# Patient Record
Sex: Male | Born: 1960 | Race: White | Hispanic: No | Marital: Single | Smoking: Former smoker
Health system: Southern US, Community
[De-identification: ages and names within clinical notes are randomized; demographics above are authoritative.]

## PROBLEM LIST (undated history)

## (undated) DIAGNOSIS — E039 Hypothyroidism, unspecified: Secondary | ICD-10-CM

## (undated) DIAGNOSIS — N400 Enlarged prostate without lower urinary tract symptoms: Secondary | ICD-10-CM

## (undated) DIAGNOSIS — I2510 Atherosclerotic heart disease of native coronary artery without angina pectoris: Secondary | ICD-10-CM

## (undated) DIAGNOSIS — N401 Enlarged prostate with lower urinary tract symptoms: Secondary | ICD-10-CM

## (undated) DIAGNOSIS — A630 Anogenital (venereal) warts: Secondary | ICD-10-CM

## (undated) DIAGNOSIS — N138 Other obstructive and reflux uropathy: Secondary | ICD-10-CM

## (undated) DIAGNOSIS — I10 Essential (primary) hypertension: Secondary | ICD-10-CM

## (undated) DIAGNOSIS — Z720 Tobacco use: Secondary | ICD-10-CM

## (undated) HISTORY — DX: Benign prostatic hyperplasia without lower urinary tract symptoms: N40.0

## (undated) HISTORY — DX: Essential (primary) hypertension: I10

## (undated) HISTORY — PX: WISDOM TOOTH EXTRACTION: SHX21

## (undated) HISTORY — DX: Other obstructive and reflux uropathy: N40.1

## (undated) HISTORY — DX: Benign prostatic hyperplasia with lower urinary tract symptoms: N13.8

## (undated) HISTORY — DX: Tobacco use: Z72.0

## (undated) HISTORY — DX: Hypothyroidism, unspecified: E03.9

## (undated) HISTORY — DX: Anogenital (venereal) warts: A63.0

---

## 1998-10-20 ENCOUNTER — Emergency Department (HOSPITAL_COMMUNITY): Admission: EM | Admit: 1998-10-20 | Discharge: 1998-10-20 | Payer: Self-pay | Admitting: Internal Medicine

## 1998-12-14 HISTORY — PX: KNEE ARTHROSCOPY: SUR90

## 2003-12-15 HISTORY — PX: LASIK: SHX215

## 2011-11-27 ENCOUNTER — Encounter: Payer: Self-pay | Admitting: Internal Medicine

## 2011-11-27 ENCOUNTER — Other Ambulatory Visit (INDEPENDENT_AMBULATORY_CARE_PROVIDER_SITE_OTHER): Payer: Managed Care, Other (non HMO)

## 2011-11-27 ENCOUNTER — Ambulatory Visit (INDEPENDENT_AMBULATORY_CARE_PROVIDER_SITE_OTHER): Payer: Managed Care, Other (non HMO) | Admitting: Internal Medicine

## 2011-11-27 VITALS — BP 160/92 | HR 86 | Temp 98.8°F | Resp 16 | Wt 220.8 lb

## 2011-11-27 DIAGNOSIS — Z0000 Encounter for general adult medical examination without abnormal findings: Secondary | ICD-10-CM

## 2011-11-27 DIAGNOSIS — R9431 Abnormal electrocardiogram [ECG] [EKG]: Secondary | ICD-10-CM

## 2011-11-27 DIAGNOSIS — I10 Essential (primary) hypertension: Secondary | ICD-10-CM

## 2011-11-27 LAB — CBC WITH DIFFERENTIAL/PLATELET
Basophils Absolute: 0 10*3/uL (ref 0.0–0.1)
Eosinophils Relative: 0.3 % (ref 0.0–5.0)
MCV: 95.5 fl (ref 78.0–100.0)
Monocytes Absolute: 0.8 10*3/uL (ref 0.1–1.0)
Neutrophils Relative %: 80.3 % — ABNORMAL HIGH (ref 43.0–77.0)
Platelets: 186 10*3/uL (ref 150.0–400.0)
RDW: 12.8 % (ref 11.5–14.6)
WBC: 11.3 10*3/uL — ABNORMAL HIGH (ref 4.5–10.5)

## 2011-11-27 LAB — COMPREHENSIVE METABOLIC PANEL
ALT: 23 U/L (ref 0–53)
AST: 23 U/L (ref 0–37)
Albumin: 4.2 g/dL (ref 3.5–5.2)
Alkaline Phosphatase: 64 U/L (ref 39–117)
Glucose, Bld: 82 mg/dL (ref 70–99)
Potassium: 4.7 mEq/L (ref 3.5–5.1)
Sodium: 140 mEq/L (ref 135–145)
Total Bilirubin: 0.4 mg/dL (ref 0.3–1.2)
Total Protein: 7.5 g/dL (ref 6.0–8.3)

## 2011-11-27 LAB — URINALYSIS, ROUTINE W REFLEX MICROSCOPIC
Bilirubin Urine: NEGATIVE
Ketones, ur: NEGATIVE
Specific Gravity, Urine: <=1.005 (ref 1.000–1.030)
Total Protein, Urine: NEGATIVE
Urine Glucose: NEGATIVE
pH: 6 (ref 5.0–8.0)

## 2011-11-27 LAB — PSA: PSA: 0.34 ng/mL (ref 0.10–4.00)

## 2011-11-27 LAB — LIPID PANEL
HDL: 39.7 mg/dL (ref >39.00)
Total CHOL/HDL Ratio: 4
Triglycerides: 109 mg/dL (ref 0.0–149.0)
VLDL: 21.8 mg/dL (ref 0.0–40.0)

## 2011-11-27 MED ORDER — OLMESARTAN MEDOXOMIL 20 MG PO TABS: 20.0000 mg | ORAL_TABLET | Freq: Every day | ORAL | Status: DC

## 2011-11-27 NOTE — Assessment & Plan Note (Signed)
Check labs for secondary causes and to look for end organ damage and will start benicar today

## 2011-11-27 NOTE — Patient Instructions (Signed)
Health Maintenance, Males A healthy lifestyle and preventative care can promote health and wellness.  Maintain regular health, dental, and eye exams.   Eat a healthy diet. Foods like vegetables, fruits, whole grains, low-fat dairy products, and lean protein foods contain the nutrients you need without too many calories. Decrease your intake of foods high in solid fats, added sugars, and salt. Get information about a proper diet from your caregiver, if necessary.   Regular physical exercise is one of the most important things you can do for your health. Most adults should get at least 150 minutes of moderate-intensity exercise (any activity that increases your heart rate and causes you to sweat) each week. In addition, most adults need muscle-strengthening exercises on 2 or more days a week.    Maintain a healthy weight. The body mass index (BMI) is a screening tool to identify possible weight problems. It provides an estimate of body fat based on height and weight. Your caregiver can help determine your BMI, and can help you achieve or maintain a healthy weight. For adults 20 years and older:   A BMI below 18.5 is considered underweight.   A BMI of 18.5 to 24.9 is normal.   A BMI of 25 to 29.9 is considered overweight.   A BMI of 30 and above is considered obese.   Maintain normal blood lipids and cholesterol by exercising and minimizing your intake of saturated fat. Eat a balanced diet with plenty of fruits and vegetables. Blood tests for lipids and cholesterol should begin at age 20 and be repeated every 5 years. If your lipid or cholesterol levels are high, you are over 50, or you are a high risk for heart disease, you may need your cholesterol levels checked more frequently.Ongoing high lipid and cholesterol levels should be treated with medicines, if diet and exercise are not effective.   If you smoke, find out from your caregiver how to quit. If you do not use tobacco, do not start.    If you choose to drink alcohol, do not exceed 2 drinks per day. One drink is considered to be 12 ounces (355 mL) of beer, 5 ounces (148 mL) of wine, or 1.5 ounces (44 mL) of liquor.   Avoid use of street drugs. Do not share needles with anyone. Ask for help if you need support or instructions about stopping the use of drugs.   High blood pressure causes heart disease and increases the risk of stroke. Blood pressure should be checked at least every 1 to 2 years. Ongoing high blood pressure should be treated with medicines if weight loss and exercise are not effective.   If you are 45 to 50 years old, ask your caregiver if you should take aspirin to prevent heart disease.   Diabetes screening involves taking a blood sample to check your fasting blood sugar level. This should be done once every 3 years, after age 45, if you are within normal weight and without risk factors for diabetes. Testing should be considered at a younger age or be carried out more frequently if you are overweight and have at least 1 risk factor for diabetes.   Colorectal cancer can be detected and often prevented. Most routine colorectal cancer screening begins at the age of 50 and continues through age 75. However, your caregiver may recommend screening at an earlier age if you have risk factors for colon cancer. On a yearly basis, your caregiver may provide home test kits to check for hidden   blood in the stool. Use of a small camera at the end of a tube, to directly examine the colon (sigmoidoscopy or colonoscopy), can detect the earliest forms of colorectal cancer. Talk to your caregiver about this at age 50, when routine screening begins. Direct examination of the colon should be repeated every 5 to 10 years through age 75, unless early forms of pre-cancerous polyps or small growths are found.   Healthy men should no longer receive prostate-specific antigen (PSA) blood tests as part of routine cancer screening. Consult with  your caregiver about prostate cancer screening.   Practice safe sex. Use condoms and avoid high-risk sexual practices to reduce the spread of sexually transmitted infections (STIs).   Use sunscreen with a sun protection factor (SPF) of 30 or greater. Apply sunscreen liberally and repeatedly throughout the day. You should seek shade when your shadow is shorter than you. Protect yourself by wearing long sleeves, pants, a wide-brimmed hat, and sunglasses year round, whenever you are outdoors.   Notify your caregiver of new moles or changes in moles, especially if there is a change in shape or color. Also notify your caregiver if a mole is larger than the size of a pencil eraser.   A one-time screening for abdominal aortic aneurysm (AAA) and surgical repair of large AAAs by sound wave imaging (ultrasonography) is recommended for ages 65 to 75 years who are current or former smokers.   Stay current with your immunizations.  Document Released: 05/28/2008 Document Revised: 08/12/2011 Document Reviewed: 04/27/2011 [employer] Patient Information 2012 [employer].Hypertension As your heart beats, it forces blood through your arteries. This force is your blood pressure. If the pressure is too high, it is called hypertension (HTN) or high blood pressure. HTN is dangerous because you may have it and not know it. High blood pressure may mean that your heart has to work harder to pump blood. Your arteries may be narrow or stiff. The extra work puts you at risk for heart disease, stroke, and other problems.  Blood pressure consists of two numbers, a higher number over a lower, 110/72, for example. It is stated as "110 over 72." The ideal is below 120 for the top number (systolic) and under 80 for the bottom (diastolic). Write down your blood pressure today. You should pay close attention to your blood pressure if you have certain conditions such as:  Heart failure.   Prior heart attack.   Diabetes   Chronic  kidney disease.   Prior stroke.   Multiple risk factors for heart disease.  To see if you have HTN, your blood pressure should be measured while you are seated with your arm held at the level of the heart. It should be measured at least twice. A one-time elevated blood pressure reading (especially in the Emergency Department) does not mean that you need treatment. There may be conditions in which the blood pressure is different between your right and left arms. It is important to see your caregiver soon for a recheck. Most people have essential hypertension which means that there is not a specific cause. This type of high blood pressure may be lowered by changing lifestyle factors such as:  Stress.   Smoking.   Lack of exercise.   Excessive weight.   Drug/tobacco/alcohol use.   Eating less salt.  Most people do not have symptoms from high blood pressure until it has caused damage to the body. Effective treatment can often prevent, delay or reduce that damage. TREATMENT    When a cause has been identified, treatment for high blood pressure is directed at the cause. There are a large number of medications to treat HTN. These fall into several categories, and your caregiver will help you select the medicines that are best for you. Medications may have side effects. You should review side effects with your caregiver. If your blood pressure stays high after you have made lifestyle changes or started on medicines,   Your medication(s) may need to be changed.   Other problems may need to be addressed.   Be certain you understand your prescriptions, and know how and when to take your medicine.   Be sure to follow up with your caregiver within the time frame advised (usually within two weeks) to have your blood pressure rechecked and to review your medications.   If you are taking more than one medicine to lower your blood pressure, make sure you know how and at what times they should be taken.  Taking two medicines at the same time can result in blood pressure that is too low.  SEEK IMMEDIATE MEDICAL CARE IF:  You develop a severe headache, blurred or changing vision, or confusion.   You have unusual weakness or numbness, or a faint feeling.   You have severe chest or abdominal pain, vomiting, or breathing problems.  MAKE SURE YOU:   Understand these instructions.   Will watch your condition.   Will get help right away if you are not doing well or get worse.  Document Released: 11/30/2005 Document Revised: 08/12/2011 Document Reviewed: 07/20/2008 Exit[employer] Patient Information 2012 [employer]. 

## 2011-11-27 NOTE — Assessment & Plan Note (Signed)
Exam done, labs ordered, GI referral done, vaccines updated, pt ed material was given

## 2011-11-27 NOTE — Progress Notes (Signed)
  Subjective:    Patient ID: Henry Owen, male    DOB: November 06, 1961, 50 y.o.   MRN: [id number]  Hypertension This is a chronic problem. The current episode started more than 1 year ago. The problem has been gradually worsening since onset. The problem is uncontrolled. Pertinent negatives include no anxiety, blurred vision, chest pain, headaches, malaise/fatigue, neck pain, orthopnea, palpitations, peripheral edema, PND, shortness of breath or sweats. There are no associated agents to hypertension. Past treatments include nothing. Compliance problems include exercise and diet.       Review of Systems  Constitutional: Negative for malaise/fatigue.  HENT: Negative for neck pain.   Eyes: Negative for blurred vision.  Respiratory: Negative for shortness of breath.   Cardiovascular: Negative for chest pain, palpitations, orthopnea and PND.  Neurological: Negative for headaches.       Objective:   Physical Exam  Vitals reviewed. Constitutional: He is oriented to person, place, and time. He appears well-developed and well-nourished. No distress.  HENT:  Head: Normocephalic and atraumatic.  Mouth/Throat: Oropharynx is clear and moist. No oropharyngeal exudate.  Eyes: Conjunctivae are normal. Right eye exhibits no discharge. Left eye exhibits no discharge. No scleral icterus.  Neck: Normal range of motion. Neck supple. No JVD present. No tracheal deviation present. No thyromegaly present.  Cardiovascular: Normal rate, regular rhythm, normal heart sounds and intact distal pulses.  Exam reveals no gallop and no friction rub.   No murmur heard. Pulmonary/Chest: Effort normal and breath sounds normal. No stridor. No respiratory distress. He has no wheezes. He has no rales. He exhibits no tenderness.  Abdominal: Soft. Bowel sounds are normal. He exhibits no distension and no mass. There is no tenderness. There is no rebound and no guarding. Hernia confirmed negative in the right inguinal area and  confirmed negative in the left inguinal area.  Genitourinary: Rectum normal, prostate normal, testes normal and penis normal. Rectal exam shows no external hemorrhoid, no internal hemorrhoid, no fissure, no mass, no tenderness and anal tone normal. Guaiac negative stool. Prostate is not enlarged and not tender. Right testis shows no mass, no swelling and no tenderness. Right testis is descended. Left testis shows no mass, no swelling and no tenderness. Left testis is descended. Uncircumcised. No phimosis, paraphimosis, hypospadias, penile erythema or penile tenderness. No discharge found.  Musculoskeletal: Normal range of motion. He exhibits no edema and no tenderness.  Lymphadenopathy:    He has no cervical adenopathy.       Right: No inguinal adenopathy present.       Left: No inguinal adenopathy present.  Neurological: He is oriented to person, place, and time.  Skin: Skin is warm and dry. No rash noted. He is not diaphoretic. No erythema. No pallor.  Psychiatric: He has a normal mood and affect. His behavior is normal. Judgment and thought content normal.          Assessment & Plan:

## 2011-11-27 NOTE — Assessment & Plan Note (Signed)
His EKG reveals poor R waves in V1 and V2, this may be a normal variant but he does have a very significant FH so I have asked him to see cardiology

## 2011-11-29 ENCOUNTER — Encounter: Payer: Self-pay | Admitting: Internal Medicine

## 2011-11-30 ENCOUNTER — Ambulatory Visit (INDEPENDENT_AMBULATORY_CARE_PROVIDER_SITE_OTHER): Payer: Managed Care, Other (non HMO) | Admitting: Cardiology

## 2011-11-30 ENCOUNTER — Encounter: Payer: Self-pay | Admitting: Cardiology

## 2011-11-30 DIAGNOSIS — F17200 Nicotine dependence, unspecified, uncomplicated: Secondary | ICD-10-CM

## 2011-11-30 DIAGNOSIS — Z720 Tobacco use: Secondary | ICD-10-CM | POA: Insufficient documentation

## 2011-11-30 DIAGNOSIS — R9431 Abnormal electrocardiogram [ECG] [EKG]: Secondary | ICD-10-CM

## 2011-11-30 DIAGNOSIS — I10 Essential (primary) hypertension: Secondary | ICD-10-CM

## 2011-11-30 DIAGNOSIS — R079 Chest pain, unspecified: Secondary | ICD-10-CM | POA: Insufficient documentation

## 2011-11-30 NOTE — Progress Notes (Signed)
   HPI: 50 year old male with no prior cardiac history for evaluation of abnormal electrocardiogram and chest pain. Patient has had occasional chest pain for approximately 3 years. It occurs in his left and right lateral chest area. It lasts approximately 1 minute and resolves spontaneously. Not exertional. No associated symptoms. It can increase with inspiration. He had a recent electrocardiogram. This showed sinus rhythm and cannot rule out prior septal infarct. Because of the above we were asked to further evaluate.  Current Outpatient Prescriptions  Medication Sig Dispense Refill  . ibuprofen (ADVIL,MOTRIN) 100 MG tablet Take 100 mg by mouth every 6 (six) hours as needed.        Marland Kitchen olmesartan (BENICAR) 20 MG tablet Take 1 tablet (20 mg total) by mouth daily.  49 tablet  0     Past Medical History  Diagnosis Date  . Warts, genital   . Hypertension     Past Surgical History  Procedure Date  . Lasik 2005  . Knee arthroscopy 2000    History   Social History  . Marital Status: Single    Spouse Name: N/A    Number of Children: N/A  . Years of Education: N/A   Occupational History  . Not on file.   Social History Main Topics  . Smoking status: Current Everyday Smoker -- 1.5 packs/day for 25 years    Types: Cigarettes  . Smokeless tobacco: Not on file  . Alcohol Use: No  . Drug Use: No  . Sexually Active: Yes   Other Topics Concern  . Not on file   Social History Narrative   Caffienated drinks-yesSeat belt use often-yesRegular Exercise-noSmoke alarm in the home-yesFirearms/guns in the home-yesHistory of physical abuse-no    ROS: no fevers or chills, productive cough, hemoptysis, dysphasia, odynophagia, melena, hematochezia, dysuria, hematuria, rash, seizure activity, orthopnea, PND, pedal edema, claudication. Remaining systems are negative.  Physical Exam: Well-developed well-nourished in no acute distress.  Skin is warm and dry.  HEENT is normal.  Neck is supple. No  thyromegaly.  Chest is clear to auscultation with normal expansion.  Cardiovascular exam is regular rate and rhythm.  Abdominal exam nontender or distended. No masses palpated. Extremities show no edema. neuro grossly intact  ECG NSR, No ST changes 11/27/11 NSR, CRO prior septal MI

## 2011-11-30 NOTE — Patient Instructions (Signed)
Your physician recommends that you schedule a follow-up appointment in: AS NEEDED  Your physician has requested that you have a stress echocardiogram. For further information please visit [email or web address]. Please follow instruction sheet as given.  

## 2011-11-30 NOTE — Assessment & Plan Note (Signed)
Previous electrocardiogram shows sinus rhythm, cannot rule out prior septal infarct. I think this is most likely related to lead placement. However multiple risk factors including tobacco use and strong family history. Also with atypical chest pain. Schedule stress echocardiogram to rule out ischemia and evaluate wall motion. If normal no further evaluation.

## 2011-11-30 NOTE — Assessment & Plan Note (Signed)
Symptoms atypical. Await stress echocardiogram results.

## 2011-11-30 NOTE — Assessment & Plan Note (Signed)
Benicar recently initiated. Continue to follow blood pressure. Increase meds as needed. This will be cared for by primary care.

## 2011-11-30 NOTE — Assessment & Plan Note (Signed)
Patient counseled on discontinuing. 

## 2011-12-03 ENCOUNTER — Ambulatory Visit (HOSPITAL_BASED_OUTPATIENT_CLINIC_OR_DEPARTMENT_OTHER): Payer: Managed Care, Other (non HMO) | Admitting: Radiology

## 2011-12-03 ENCOUNTER — Ambulatory Visit (HOSPITAL_COMMUNITY): Payer: Managed Care, Other (non HMO) | Attending: Cardiology | Admitting: Radiology

## 2011-12-03 DIAGNOSIS — R072 Precordial pain: Secondary | ICD-10-CM

## 2011-12-03 DIAGNOSIS — F17200 Nicotine dependence, unspecified, uncomplicated: Secondary | ICD-10-CM | POA: Insufficient documentation

## 2011-12-03 DIAGNOSIS — R0609 Other forms of dyspnea: Secondary | ICD-10-CM | POA: Insufficient documentation

## 2011-12-03 DIAGNOSIS — R0989 Other specified symptoms and signs involving the circulatory and respiratory systems: Secondary | ICD-10-CM

## 2011-12-16 ENCOUNTER — Encounter: Payer: Self-pay | Admitting: Gastroenterology

## 2011-12-21 ENCOUNTER — Ambulatory Visit (AMBULATORY_SURGERY_CENTER): Payer: Managed Care, Other (non HMO) | Admitting: *Deleted

## 2011-12-21 DIAGNOSIS — Z1211 Encounter for screening for malignant neoplasm of colon: Secondary | ICD-10-CM

## 2011-12-21 MED ORDER — PEG-KCL-NACL-NASULF-NA ASC-C 100 G PO SOLR: ORAL | Status: DC

## 2011-12-28 ENCOUNTER — Encounter: Payer: Self-pay | Admitting: Gastroenterology

## 2011-12-28 ENCOUNTER — Encounter: Payer: Managed Care, Other (non HMO) | Admitting: Gastroenterology

## 2011-12-28 ENCOUNTER — Ambulatory Visit (AMBULATORY_SURGERY_CENTER): Payer: Managed Care, Other (non HMO) | Admitting: Gastroenterology

## 2011-12-28 VITALS — BP 144/89 | HR 84 | Temp 97.6°F | Resp 15 | Ht 72.0 in | Wt 221.0 lb

## 2011-12-28 DIAGNOSIS — D126 Benign neoplasm of colon, unspecified: Secondary | ICD-10-CM

## 2011-12-28 DIAGNOSIS — Z1211 Encounter for screening for malignant neoplasm of colon: Secondary | ICD-10-CM

## 2011-12-28 LAB — HM COLONOSCOPY

## 2011-12-28 MED ORDER — SODIUM CHLORIDE 0.9 % IV SOLN: 500.0000 mL | INTRAVENOUS | Status: DC

## 2011-12-28 NOTE — Progress Notes (Signed)
Patient did not experience any of the following events: a burn prior to discharge; a fall within the facility; wrong site/side/patient/procedure/implant event; or a hospital transfer or hospital admission upon discharge from the facility. (G8907)  

## 2011-12-28 NOTE — Patient Instructions (Addendum)
One of your biggest health concerns is your smoking.  This increases your risk for most cancers and serious cardiovascular diseases such as strokes, heart attacks.  You should try your best to stop.  If you need assistance, please contact your PCP or Smoking Cessation Class at [facility] ([phone]) or [facility] (1-800-QUIT-NOW). FOLLOW DISCHARGE INSTRUCTIONS (BLUE AND GREEN SHEETS). 

## 2011-12-28 NOTE — Op Note (Signed)
[facility] [address] [place], [place]  [address]  COLONOSCOPY PROCEDURE REPORT  PATIENT:  Henry Owen, Henry Owen  MR#:  [id number] BIRTHDATE:  [birth year], 50 yrs. old  GENDER:  male ENDOSCOPIST:  Rachael Fee, MD REF. BY:  Etta Grandchild, M.D. PROCEDURE DATE:  12/28/2011 PROCEDURE:  Colonoscopy with snare polypectomy ASA CLASS:  Class II INDICATIONS:  Routine Risk Screening MEDICATIONS:   Fentanyl 75 mcg IV, These medications were titrated to patient response per physician's verbal order, Versed 6 mg IV  DESCRIPTION OF PROCEDURE:   After the risks benefits and alternatives of the procedure were thoroughly explained, informed consent was obtained.  Digital rectal exam was performed and revealed no rectal masses.   The LB [id number] endoscope was introduced through the anus and advanced to the cecum, which was identified by both the appendix and ileocecal valve, without limitations.  The quality of the prep was fair..  The instrument was then slowly withdrawn as the colon was fully examined. <<PROCEDUREIMAGES>> FINDINGS:  A diminutive polyp was found in the transverse colon. This was removed with cold snare but not retrieved (see image3). This was otherwise a normal examination of the colon (see image4, image2, and image1).   Retroflexed views in the rectum revealed no abnormalities. COMPLICATIONS:  None  ENDOSCOPIC IMPRESSION: 1) Diminutive polyp in the transverse colon; removed but not retrieved. 2) Otherwise normal examination  RECOMMENDATIONS: 1) You should continue to follow colorectal cancer screening guidelines for "routine risk" patients with a repeat colonoscopy in 10 years  REPEAT EXAM:  10 years  ______________________________ Rachael Fee, MD  n. eSIGNED:   Rachael Fee at 12/28/2011 03:16 PM  Crista Curb, [id number]

## 2011-12-29 ENCOUNTER — Telehealth: Payer: Self-pay | Admitting: *Deleted

## 2011-12-29 NOTE — Telephone Encounter (Signed)

## 2012-01-13 ENCOUNTER — Encounter: Payer: Self-pay | Admitting: Internal Medicine

## 2012-01-13 ENCOUNTER — Ambulatory Visit (INDEPENDENT_AMBULATORY_CARE_PROVIDER_SITE_OTHER): Payer: Managed Care, Other (non HMO) | Admitting: Internal Medicine

## 2012-01-13 VITALS — BP 120/68 | HR 80 | Temp 98.8°F | Resp 16 | Wt 228.0 lb

## 2012-01-13 DIAGNOSIS — I10 Essential (primary) hypertension: Secondary | ICD-10-CM

## 2012-01-13 DIAGNOSIS — R9431 Abnormal electrocardiogram [ECG] [EKG]: Secondary | ICD-10-CM

## 2012-01-13 DIAGNOSIS — F17200 Nicotine dependence, unspecified, uncomplicated: Secondary | ICD-10-CM

## 2012-01-13 DIAGNOSIS — Z720 Tobacco use: Secondary | ICD-10-CM

## 2012-01-13 MED ORDER — OLMESARTAN MEDOXOMIL 20 MG PO TABS: 20.0000 mg | ORAL_TABLET | Freq: Every day | ORAL | Status: DC

## 2012-01-13 NOTE — Progress Notes (Signed)
  Subjective:    Patient ID: Henry Owen, male    DOB: March 17, 1961, 51 y.o.   MRN: [id number]  Hypertension This is a chronic problem. The current episode started more than 1 year ago. The problem has been gradually improving since onset. The problem is controlled. Pertinent negatives include no anxiety, blurred vision, chest pain, headaches, malaise/fatigue, neck pain, orthopnea, palpitations, peripheral edema, PND, shortness of breath or sweats. Past treatments include angiotensin blockers. The current treatment provides significant improvement. Compliance problems include exercise and diet.       Review of Systems  Constitutional: Negative.  Negative for malaise/fatigue.  HENT: Negative.  Negative for neck pain.   Eyes: Negative.  Negative for blurred vision.  Respiratory: Negative.  Negative for shortness of breath.   Cardiovascular: Negative.  Negative for chest pain, palpitations, orthopnea and PND.  Gastrointestinal: Negative.   Genitourinary: Negative.   Musculoskeletal: Negative.   Skin: Negative.   Neurological: Negative.  Negative for headaches.  Hematological: Negative.   Psychiatric/Behavioral: Negative.        Objective:   Physical Exam  Vitals reviewed. Constitutional: He is oriented to person, place, and time. He appears well-developed and well-nourished. No distress.  HENT:  Head: Normocephalic and atraumatic.  Mouth/Throat: Oropharynx is clear and moist. No oropharyngeal exudate.  Eyes: Conjunctivae are normal. Right eye exhibits no discharge. Left eye exhibits no discharge. No scleral icterus.  Neck: Normal range of motion. Neck supple. No JVD present. No tracheal deviation present. No thyromegaly present.  Cardiovascular: Normal rate, regular rhythm, normal heart sounds and intact distal pulses.  Exam reveals no gallop and no friction rub.   No murmur heard. Pulmonary/Chest: Breath sounds normal. No stridor. No respiratory distress. He has no wheezes. He has  no rales. He exhibits no tenderness.  Abdominal: Soft. Bowel sounds are normal. He exhibits no distension and no mass. There is no tenderness. There is no rebound and no guarding.  Musculoskeletal: Normal range of motion. He exhibits no edema and no tenderness.  Lymphadenopathy:    He has no cervical adenopathy.  Neurological: He is oriented to person, place, and time.  Skin: Skin is warm and dry. No rash noted. He is not diaphoretic. No erythema. No pallor.  Psychiatric: He has a normal mood and affect. His behavior is normal. Judgment and thought content normal.      Lab Results  Component Value Date   WBC 11.3* 11/27/2011   HGB 15.7 11/27/2011   HCT 46.3 11/27/2011   PLT 186.0 11/27/2011   GLUCOSE 82 11/27/2011   CHOL 159 11/27/2011   TRIG 109.0 11/27/2011   HDL 39.70 11/27/2011   LDLCALC 98 11/27/2011   ALT 23 11/27/2011   AST 23 11/27/2011   NA 140 11/27/2011   K 4.7 11/27/2011   CL 106 11/27/2011   CREATININE 1.0 11/27/2011   BUN 13 11/27/2011   CO2 28 11/27/2011   TSH 4.05 11/27/2011   PSA 0.34 11/27/2011      Assessment & Plan:

## 2012-01-13 NOTE — Patient Instructions (Addendum)
Hypertension As your heart beats, it forces blood through your arteries. This force is your blood pressure. If the pressure is too high, it is called hypertension (HTN) or high blood pressure. HTN is dangerous because you may have it and not know it. High blood pressure may mean that your heart has to work harder to pump blood. Your arteries may be narrow or stiff. The extra work puts you at risk for heart disease, stroke, and other problems.  Blood pressure consists of two numbers, a higher number over a lower, 110/72, for example. It is stated as "110 over 72." The ideal is below 120 for the top number (systolic) and under 80 for the bottom (diastolic). Write down your blood pressure today. You should pay close attention to your blood pressure if you have certain conditions such as:  Heart failure.   Prior heart attack.   Diabetes   Chronic kidney disease.   Prior stroke.   Multiple risk factors for heart disease.  To see if you have HTN, your blood pressure should be measured while you are seated with your arm held at the level of the heart. It should be measured at least twice. A one-time elevated blood pressure reading (especially in the Emergency Department) does not mean that you need treatment. There may be conditions in which the blood pressure is different between your right and left arms. It is important to see your caregiver soon for a recheck. Most people have essential hypertension which means that there is not a specific cause. This type of high blood pressure may be lowered by changing lifestyle factors such as:  Stress.   Smoking.   Lack of exercise.   Excessive weight.   Drug/tobacco/alcohol use.   Eating less salt.  Most people do not have symptoms from high blood pressure until it has caused damage to the body. Effective treatment can often prevent, delay or reduce that damage. TREATMENT  When a cause has been identified, treatment for high blood pressure is  directed at the cause. There are a large number of medications to treat HTN. These fall into several categories, and your caregiver will help you select the medicines that are best for you. Medications may have side effects. You should review side effects with your caregiver. If your blood pressure stays high after you have made lifestyle changes or started on medicines,   Your medication(s) may need to be changed.   Other problems may need to be addressed.   Be certain you understand your prescriptions, and know how and when to take your medicine.   Be sure to follow up with your caregiver within the time frame advised (usually within two weeks) to have your blood pressure rechecked and to review your medications.   If you are taking more than one medicine to lower your blood pressure, make sure you know how and at what times they should be taken. Taking two medicines at the same time can result in blood pressure that is too low.  SEEK IMMEDIATE MEDICAL CARE IF:  You develop a severe headache, blurred or changing vision, or confusion.   You have unusual weakness or numbness, or a faint feeling.   You have severe chest or abdominal pain, vomiting, or breathing problems.  MAKE SURE YOU:   Understand these instructions.   Will watch your condition.   Will get help right away if you are not doing well or get worse.  Document Released: 11/30/2005 Document Revised: 08/12/2011 Document Reviewed:   07/20/2008 [employer] Patient Information 2012 [employer].Smoking Cessation This document explains the best ways for you to quit smoking and new treatments to help. It lists new medicines that can double or triple your chances of quitting and quitting for good. It also considers ways to avoid relapses and concerns you may have about quitting, including weight gain. NICOTINE: A POWERFUL ADDICTION If you have tried to quit smoking, you know how hard it can be. It is hard because nicotine is a very  addictive drug. For some people, it can be as addictive as heroin or cocaine. Usually, people make 2 or 3 tries, or more, before finally being able to quit. Each time you try to quit, you can learn about what helps and what hurts. Quitting takes hard work and a lot of effort, but you can quit smoking. QUITTING SMOKING IS ONE OF THE MOST IMPORTANT THINGS YOU WILL EVER DO.  You will live longer, feel better, and live better.   The impact on your body of quitting smoking is felt almost immediately:   Within 20 minutes, blood pressure decreases. Pulse returns to its normal level.   After 8 hours, carbon monoxide levels in the blood return to normal. Oxygen level increases.   After 24 hours, chance of heart attack starts to decrease. Breath, hair, and body stop smelling like smoke.   After 48 hours, damaged nerve endings begin to recover. Sense of taste and smell improve.   After 72 hours, the body is virtually free of nicotine. Bronchial tubes relax and breathing becomes easier.   After 2 to 12 weeks, lungs can hold more air. Exercise becomes easier and circulation improves.   Quitting will reduce your risk of having a heart attack, stroke, cancer, or lung disease:   After 1 year, the risk of coronary heart disease is cut in half.   After 5 years, the risk of stroke falls to the same as a nonsmoker.   After 10 years, the risk of lung cancer is cut in half and the risk of other cancers decreases significantly.   After 15 years, the risk of coronary heart disease drops, usually to the level of a nonsmoker.   If you are pregnant, quitting smoking will improve your chances of having a healthy baby.   The people you live with, especially your children, will be healthier.   You will have extra money to spend on things other than cigarettes.  FIVE KEYS TO QUITTING Studies have shown that these 5 steps will help you quit smoking and quit for good. You have the best chances of quitting if you  use them together: 1. Get ready.  2. Get support and encouragement.  3. Learn new skills and behaviors.  4. Get medicine to reduce your nicotine addiction and use it correctly.  5. Be prepared for relapse or difficult situations. Be determined to continue trying to quit, even if you do not succeed at first.  1. GET READY  Set a quit date.   Change your environment.   Get rid of ALL cigarettes, ashtrays, matches, and lighters in your home, car, and place of work.   Do not let people smoke in your home.   Review your past attempts to quit. Think about what worked and what did not.   Once you quit, do not smoke. NOT EVEN A PUFF!  2. GET SUPPORT AND ENCOURAGEMENT Studies have shown that you have a better chance of being successful if you have help. You can   get support in many ways.  Tell your family, friends, and coworkers that you are going to quit and need their support. Ask them not to smoke around you.   Talk to your caregivers (doctor, dentist, nurse, pharmacist, psychologist, and/or smoking counselor).   Get individual, group, or telephone counseling and support. The more counseling you have, the better your chances are of quitting. Programs are available at local hospitals and health centers. Call your local health department for information about programs in your area.   Spiritual beliefs and practices may help some smokers quit.   Quit meters are small computer programs online or downloadable that keep track of quit statistics, such as amount of "quit-time," cigarettes not smoked, and money saved.   Many smokers find one or more of the many self-help books available useful in helping them quit and stay off tobacco.  3. LEARN NEW SKILLS AND BEHAVIORS  Try to distract yourself from urges to smoke. Talk to someone, go for a walk, or occupy your time with a task.   When you first try to quit, change your routine. Take a different route to work. Drink tea instead of coffee. Eat  breakfast in a different place.   Do something to reduce your stress. Take a hot bath, exercise, or read a book.   Plan something enjoyable to do every day. Reward yourself for not smoking.   Explore interactive web-based programs that specialize in helping you quit.  4. GET MEDICINE AND USE IT CORRECTLY Medicines can help you stop smoking and decrease the urge to smoke. Combining medicine with the above behavioral methods and support can quadruple your chances of successfully quitting smoking. The [place] Food and Drug Administration (FDA) has approved 7 medicines to help you quit smoking. These medicines fall into 3 categories.  Nicotine replacement therapy (delivers nicotine to your body without the negative effects and risks of smoking):   Nicotine gum: Available over-the-counter.   Nicotine lozenges: Available over-the-counter.   Nicotine inhaler: Available by prescription.   Nicotine nasal spray: Available by prescription.   Nicotine skin patches (transdermal): Available by prescription and over-the-counter.   Antidepressant medicine (helps people abstain from smoking, but how this works is unknown):   Bupropion sustained-release (SR) tablets: Available by prescription.   Nicotinic receptor partial agonist (simulates the effect of nicotine in your brain):   Varenicline tartrate tablets: Available by prescription.   Ask your caregiver for advice about which medicines to use and how to use them. Carefully read the information on the package.   Everyone who is trying to quit may benefit from using a medicine. If you are pregnant or trying to become pregnant, nursing an infant, you are under age 18, or you smoke fewer than 10 cigarettes per day, talk to your caregiver before taking any nicotine replacement medicines.   You should stop using a nicotine replacement product and call your caregiver if you experience nausea, dizziness, weakness, vomiting, fast or irregular heartbeat,  mouth problems with the lozenge or gum, or redness or swelling of the skin around the patch that does not go away.   Do not use any other product containing nicotine while using a nicotine replacement product.   Talk to your caregiver before using these products if you have diabetes, heart disease, asthma, stomach ulcers, you had a recent heart attack, you have high blood pressure that is not controlled with medicine, a history of irregular heartbeat, or you have been prescribed medicine to help you quit smoking.    5. BE PREPARED FOR RELAPSE OR DIFFICULT SITUATIONS  Most relapses occur within the first 3 months after quitting. Do not be discouraged if you start smoking again. Remember, most people try several times before they finally quit.   You may have symptoms of withdrawal because your body is used to nicotine. You may crave cigarettes, be irritable, feel very hungry, cough often, get headaches, or have difficulty concentrating.   The withdrawal symptoms are only temporary. They are strongest when you first quit, but they will go away within 10 to 14 days.  Here are some difficult situations to watch for:  Alcohol. Avoid drinking alcohol. Drinking lowers your chances of successfully quitting.   Caffeine. Try to reduce the amount of caffeine you consume. It also lowers your chances of successfully quitting.   Other smokers. Being around smoking can make you want to smoke. Avoid smokers.   Weight gain. Many smokers will gain weight when they quit, usually less than 10 pounds. Eat a healthy diet and stay active. Do not let weight gain distract you from your main goal, quitting smoking. Some medicines that help you quit smoking may also help delay weight gain. You can always lose the weight gained after you quit.   Bad mood or depression. There are a lot of ways to improve your mood other than smoking.  If you are having problems with any of these situations, talk to your caregiver. SPECIAL  SITUATIONS AND CONDITIONS Studies suggest that everyone can quit smoking. Your situation or condition can give you a special reason to quit.  Pregnant women/new mothers: By quitting, you protect your baby's health and your own.   Hospitalized patients: By quitting, you reduce health problems and help healing.   Heart attack patients: By quitting, you reduce your risk of a second heart attack.   Lung, head, and neck cancer patients: By quitting, you reduce your chance of a second cancer.   Parents of children and adolescents: By quitting, you protect your children from illnesses caused by secondhand smoke.  QUESTIONS TO THINK ABOUT Think about the following questions before you try to stop smoking. You may want to talk about your answers with your caregiver.  Why do you want to quit?   If you tried to quit in the past, what helped and what did not?   What will be the most difficult situations for you after you quit? How will you plan to handle them?   Who can help you through the tough times? Your family? Friends? Caregiver?   What pleasures do you get from smoking? What ways can you still get pleasure if you quit?  Here are some questions to ask your caregiver:  How can you help me to be successful at quitting?   What medicine do you think would be best for me and how should I take it?   What should I do if I need more help?   What is smoking withdrawal like? How can I get information on withdrawal?  Quitting takes hard work and a lot of effort, but you can quit smoking. FOR MORE INFORMATION  Smokefree.gov (http://www.smokefree.gov) provides free, accurate, evidence-based information and professional assistance to help support the immediate and long-term needs of people trying to quit smoking. Document Released: 11/24/2001 Document Revised: 08/12/2011 Document Reviewed: 09/16/2009 [employer] Patient Information 2012 [employer]. 

## 2012-01-13 NOTE — Assessment & Plan Note (Signed)
His BP is well controlled 

## 2012-01-13 NOTE — Assessment & Plan Note (Signed)
ETT was normal

## 2012-01-13 NOTE — Assessment & Plan Note (Signed)
He agrees to quit smoking, I told him to use nicotine pacthes

## 2013-04-07 ENCOUNTER — Other Ambulatory Visit: Payer: Self-pay | Admitting: Internal Medicine

## 2013-04-24 ENCOUNTER — Ambulatory Visit (INDEPENDENT_AMBULATORY_CARE_PROVIDER_SITE_OTHER): Payer: Managed Care, Other (non HMO) | Admitting: Internal Medicine

## 2013-04-24 ENCOUNTER — Other Ambulatory Visit (INDEPENDENT_AMBULATORY_CARE_PROVIDER_SITE_OTHER): Payer: Managed Care, Other (non HMO)

## 2013-04-24 ENCOUNTER — Encounter: Payer: Self-pay | Admitting: Internal Medicine

## 2013-04-24 VITALS — BP 132/86 | HR 87 | Temp 98.6°F | Resp 16 | Ht 72.0 in | Wt 217.0 lb

## 2013-04-24 DIAGNOSIS — I10 Essential (primary) hypertension: Secondary | ICD-10-CM

## 2013-04-24 LAB — COMPREHENSIVE METABOLIC PANEL
BUN: 9 mg/dL (ref 6–23)
CO2: 28 mEq/L (ref 19–32)
Calcium: 9.2 mg/dL (ref 8.4–10.5)
Chloride: 102 mEq/L (ref 96–112)
Creatinine, Ser: 1 mg/dL (ref 0.4–1.5)
GFR: 84.32 mL/min (ref >60.00)
Glucose, Bld: 81 mg/dL (ref 70–99)
Total Bilirubin: 0.8 mg/dL (ref 0.3–1.2)

## 2013-04-24 LAB — URINALYSIS, ROUTINE W REFLEX MICROSCOPIC
Hgb urine dipstick: NEGATIVE
Leukocytes, UA: NEGATIVE
Specific Gravity, Urine: 1.025 (ref 1.000–1.030)
Urobilinogen, UA: 0.2 (ref 0.0–1.0)

## 2013-04-24 LAB — TSH: TSH: 5.5 u[IU]/mL (ref 0.35–5.50)

## 2013-04-24 MED ORDER — OLMESARTAN MEDOXOMIL 20 MG PO TABS: 20.0000 mg | ORAL_TABLET | Freq: Every day | ORAL | Status: DC

## 2013-04-24 NOTE — Progress Notes (Signed)
  Subjective:    Patient ID: Henry Owen, male    DOB: [birth year], 52 y.o.   MRN: [id number]  Hypertension This is a chronic problem. The current episode started more than 1 year ago. The problem has been gradually improving since onset. The problem is controlled. Pertinent negatives include no anxiety, blurred vision, chest pain, headaches, malaise/fatigue, neck pain, orthopnea, palpitations, peripheral edema, PND, shortness of breath or sweats. Past treatments include angiotensin blockers. The current treatment provides significant improvement. There are no compliance problems.       Review of Systems  Constitutional: Negative.  Negative for fever, chills, malaise/fatigue, diaphoresis, activity change, appetite change, fatigue and unexpected weight change.  HENT: Negative.  Negative for neck pain.   Eyes: Negative.  Negative for blurred vision.  Respiratory: Negative.  Negative for cough, chest tightness, shortness of breath, wheezing and stridor.   Cardiovascular: Negative.  Negative for chest pain, palpitations, orthopnea, leg swelling and PND.  Gastrointestinal: Negative.  Negative for nausea, vomiting, abdominal pain, diarrhea, constipation and blood in stool.  Endocrine: Positive for cold intolerance. Negative for heat intolerance, polydipsia, polyphagia and polyuria.  Genitourinary: Negative.   Musculoskeletal: Negative.   Skin: Negative.   Allergic/Immunologic: Negative.   Neurological: Negative for dizziness and headaches.  Hematological: Negative for adenopathy. Does not bruise/bleed easily.  Psychiatric/Behavioral: Negative.        Objective:   Physical Exam  Vitals reviewed. Constitutional: He is oriented to person, place, and time. He appears well-developed and well-nourished. No distress.  HENT:  Head: Normocephalic and atraumatic.  Mouth/Throat: Oropharynx is clear and moist. No oropharyngeal exudate.  Eyes: Conjunctivae are normal. Right eye exhibits no discharge.  Left eye exhibits no discharge. No scleral icterus.  Neck: Normal range of motion. Neck supple. No JVD present. No tracheal deviation present. No thyromegaly present.  Cardiovascular: Normal rate, regular rhythm, normal heart sounds and intact distal pulses.  Exam reveals no gallop and no friction rub.   No murmur heard. Pulmonary/Chest: Effort normal and breath sounds normal. No stridor. No respiratory distress. He has no wheezes. He has no rales. He exhibits no tenderness.  Abdominal: Soft. Bowel sounds are normal. He exhibits no distension and no mass. There is no tenderness. There is no rebound and no guarding.  Musculoskeletal: Normal range of motion. He exhibits no edema and no tenderness.  Lymphadenopathy:    He has no cervical adenopathy.  Neurological: He is oriented to person, place, and time.  Skin: Skin is warm and dry. No rash noted. He is not diaphoretic. No erythema. No pallor.  Psychiatric: He has a normal mood and affect. His behavior is normal. Judgment and thought content normal.     Lab Results  Component Value Date   WBC 11.3* 11/27/2011   HGB 15.7 11/27/2011   HCT 46.3 11/27/2011   PLT 186.0 11/27/2011   GLUCOSE 82 11/27/2011   CHOL 159 11/27/2011   TRIG 109.0 11/27/2011   HDL 39.70 11/27/2011   LDLCALC 98 11/27/2011   ALT 23 11/27/2011   AST 23 11/27/2011   NA 140 11/27/2011   K 4.7 11/27/2011   CL 106 11/27/2011   CREATININE 1.0 11/27/2011   BUN 13 11/27/2011   CO2 28 11/27/2011   TSH 4.05 11/27/2011   PSA 0.34 11/27/2011       Assessment & Plan:

## 2013-04-24 NOTE — Patient Instructions (Signed)

## 2013-04-24 NOTE — Assessment & Plan Note (Signed)
His BP is well controlled Today I will check his lytes and renal function 

## 2014-04-30 ENCOUNTER — Ambulatory Visit (INDEPENDENT_AMBULATORY_CARE_PROVIDER_SITE_OTHER): Payer: Managed Care, Other (non HMO) | Admitting: Internal Medicine

## 2014-04-30 ENCOUNTER — Encounter: Payer: Self-pay | Admitting: Internal Medicine

## 2014-04-30 ENCOUNTER — Telehealth: Payer: Self-pay | Admitting: Internal Medicine

## 2014-04-30 VITALS — BP 154/98 | HR 76 | Temp 97.7°F | Resp 16 | Ht 72.0 in | Wt 225.8 lb

## 2014-04-30 DIAGNOSIS — R7989 Other specified abnormal findings of blood chemistry: Secondary | ICD-10-CM

## 2014-04-30 DIAGNOSIS — F17200 Nicotine dependence, unspecified, uncomplicated: Secondary | ICD-10-CM

## 2014-04-30 DIAGNOSIS — I10 Essential (primary) hypertension: Secondary | ICD-10-CM

## 2014-04-30 DIAGNOSIS — Z23 Encounter for immunization: Secondary | ICD-10-CM

## 2014-04-30 DIAGNOSIS — R946 Abnormal results of thyroid function studies: Secondary | ICD-10-CM

## 2014-04-30 DIAGNOSIS — Z0000 Encounter for general adult medical examination without abnormal findings: Secondary | ICD-10-CM

## 2014-04-30 DIAGNOSIS — Z720 Tobacco use: Secondary | ICD-10-CM

## 2014-04-30 LAB — FECAL OCCULT BLOOD, GUAIAC: Fecal Occult Blood: NEGATIVE

## 2014-04-30 MED ORDER — VALSARTAN-HYDROCHLOROTHIAZIDE 320-12.5 MG PO TABS: 1.0000 | ORAL_TABLET | Freq: Every day | ORAL | Status: DC

## 2014-04-30 NOTE — Patient Instructions (Signed)
Health Maintenance, Males A healthy lifestyle and preventative care can promote health and wellness.  Maintain regular health, dental, and eye exams.  Eat a healthy diet. Foods like vegetables, fruits, whole grains, low-fat dairy products, and lean protein foods contain the nutrients you need and are low in calories. Decrease your intake of foods high in solid fats, added sugars, and salt. Get information about a proper diet from your health care provider, if necessary.  Regular physical exercise is one of the most important things you can do for your health. Most adults should get at least 150 minutes of moderate-intensity exercise (any activity that increases your heart rate and causes you to sweat) each week. In addition, most adults need muscle-strengthening exercises on 2 or more days a week.   Maintain a healthy weight. The body mass index (BMI) is a screening tool to identify possible weight problems. It provides an estimate of body fat based on height and weight. Your health care provider can find your BMI and can help you achieve or maintain a healthy weight. For males 20 years and older:  A BMI below 18.5 is considered underweight.  A BMI of 18.5 to 24.9 is normal.  A BMI of 25 to 29.9 is considered overweight.  A BMI of 30 and above is considered obese.  Maintain normal blood lipids and cholesterol by exercising and minimizing your intake of saturated fat. Eat a balanced diet with plenty of fruits and vegetables. Blood tests for lipids and cholesterol should begin at age 20 and be repeated every 5 years. If your lipid or cholesterol levels are high, you are over 50, or you are at high risk for heart disease, you may need your cholesterol levels checked more frequently.Ongoing high lipid and cholesterol levels should be treated with medicines, if diet and exercise are not working.  If you smoke, find out from your health care provider how to quit. If you do not use tobacco, do not  start.  Lung cancer screening is recommended for adults aged 55 80 years who are at high risk for developing lung cancer because of a history of smoking. A yearly low-dose CT scan of the lungs is recommended for people who have at least a 30-pack-year history of smoking and are a current smoker or have quit within the past 15 years. A pack year of smoking is smoking an average of 1 pack of cigarettes a day for 1 year (for example, a 30-pack-year history of smoking could mean smoking 1 pack a day for 30 years or 2 packs a day for 15 years). Yearly screening should continue until the smoker has stopped smoking for at least 15 years. Yearly screening should be stopped for people who develop a health problem that would prevent them from having lung cancer treatment.  If you choose to drink alcohol, do not have more than 2 drinks per day. One drink is considered to be 12 oz (360 mL) of beer, 5 oz (150 mL) of wine, or 1.5 oz (45 mL) of liquor.  Avoid use of street drugs. Do not share needles with anyone. Ask for help if you need support or instructions about stopping the use of drugs.  High blood pressure causes heart disease and increases the risk of stroke. Blood pressure should be checked at least every 1 2 years. Ongoing high blood pressure should be treated with medicines if weight loss and exercise are not effective.  If you are 45 53 years old, ask your health   care provider if you should take aspirin to prevent heart disease.  Diabetes screening involves taking a blood sample to check your fasting blood sugar level. This should be done once every 3 years after age 45, if you are at a normal weight and without risk factors for diabetes. Testing should be considered at a younger age or be carried out more frequently if you are overweight and have at least 1 risk factor for diabetes.  Colorectal cancer can be detected and often prevented. Most routine colorectal cancer screening begins at the age of 50  and continues through age 75. However, your health care provider may recommend screening at an earlier age if you have risk factors for colon cancer. On a yearly basis, your health care provider may provide home test kits to check for hidden blood in the stool. A small camera at the end of a tube may be used to directly examine the colon (sigmoidoscopy or colonoscopy) to detect the earliest forms of colorectal cancer. Talk to your health care provider about this at age 50, when routine screening begins. A direct exam of the colon should be repeated every 5 10 years through age 75, unless early forms of pre-cancerous polyps or small growths are found.  People who are at an increased risk for hepatitis B should be screened for this virus. You are considered at high risk for hepatitis B if:  You were born in a country where hepatitis B occurs often. Talk with your health care provider about which countries are considered high-risk.  Your parents were born in a high-risk country and you have not received a shot to protect against hepatitis B (hepatitis B vaccine).  You have HIV or AIDS.  You use needles to inject street drugs.  You live with, or have sex with, someone who has hepatitis B.  You are a man who has sex with other men (MSM).  You get hemodialysis treatment.  You take certain medicines for conditions like cancer, organ transplantation, and autoimmune conditions.  Hepatitis C blood testing is recommended for all people born from 1945 through 1965 and any individual with known risk factors for hepatitis C.  Healthy men should no longer receive prostate-specific antigen (PSA) blood tests as part of routine cancer screening. Talk to your health care provider about prostate cancer screening.  Testicular cancer screening is not recommended for adolescents or adult males who have no symptoms. Screening includes self-exam, a health care provider exam, and other screening tests. Consult with  your health care provider about any symptoms you have or any concerns you have about testicular cancer.  Practice safe sex. Use condoms and avoid high-risk sexual practices to reduce the spread of sexually transmitted infections (STIs).  Use sunscreen. Apply sunscreen liberally and repeatedly throughout the day. You should seek shade when your shadow is shorter than you. Protect yourself by wearing long sleeves, pants, a wide-brimmed hat, and sunglasses year round, whenever you are outdoors.  Tell your health care provider of new moles or changes in moles, especially if there is a change in shape or color. Also tell your provider if a mole is larger than the size of a pencil eraser.  A one-time screening for abdominal aortic aneurysm (AAA) and surgical repair of large AAAs by ultrasound is recommended for men aged 65 75 years who are current or former smokers.  Stay current with your vaccines (immunizations). Document Released: 05/28/2008 Document Revised: 09/20/2013 Document Reviewed: 04/27/2011 ExitCare Patient Information 2014 [employer].   Hypertension As your heart beats, it forces blood through your arteries. This force is your blood pressure. If the pressure is too high, it is called hypertension (HTN) or high blood pressure. HTN is dangerous because you may have it and not know it. High blood pressure may mean that your heart has to work harder to pump blood. Your arteries may be narrow or stiff. The extra work puts you at risk for heart disease, stroke, and other problems.  Blood pressure consists of two numbers, a higher number over a lower, 110/72, for example. It is stated as "110 over 72." The ideal is below 120 for the top number (systolic) and under 80 for the bottom (diastolic). Write down your blood pressure today. You should pay close attention to your blood pressure if you have certain conditions such as:  Heart failure.  Prior heart attack.  Diabetes  Chronic kidney  disease.  Prior stroke.  Multiple risk factors for heart disease. To see if you have HTN, your blood pressure should be measured while you are seated with your arm held at the level of the heart. It should be measured at least twice. A one-time elevated blood pressure reading (especially in the Emergency Department) does not mean that you need treatment. There may be conditions in which the blood pressure is different between your right and left arms. It is important to see your caregiver soon for a recheck. Most people have essential hypertension which means that there is not a specific cause. This type of high blood pressure may be lowered by changing lifestyle factors such as:  Stress.  Smoking.  Lack of exercise.  Excessive weight.  Drug/tobacco/alcohol use.  Eating less salt. Most people do not have symptoms from high blood pressure until it has caused damage to the body. Effective treatment can often prevent, delay or reduce that damage. TREATMENT  When a cause has been identified, treatment for high blood pressure is directed at the cause. There are a large number of medications to treat HTN. These fall into several categories, and your caregiver will help you select the medicines that are best for you. Medications may have side effects. You should review side effects with your caregiver. If your blood pressure stays high after you have made lifestyle changes or started on medicines,   Your medication(s) may need to be changed.  Other problems may need to be addressed.  Be certain you understand your prescriptions, and know how and when to take your medicine.  Be sure to follow up with your caregiver within the time frame advised (usually within two weeks) to have your blood pressure rechecked and to review your medications.  If you are taking more than one medicine to lower your blood pressure, make sure you know how and at what times they should be taken. Taking two medicines  at the same time can result in blood pressure that is too low. SEEK IMMEDIATE MEDICAL CARE IF:  You develop a severe headache, blurred or changing vision, or confusion.  You have unusual weakness or numbness, or a faint feeling.  You have severe chest or abdominal pain, vomiting, or breathing problems. MAKE SURE YOU:   Understand these instructions.  Will watch your condition.  Will get help right away if you are not doing well or get worse. Document Released: 11/30/2005 Document Revised: 02/22/2012 Document Reviewed: 07/20/2008 ExitCare Patient Information 2014 [employer].  

## 2014-04-30 NOTE — Progress Notes (Signed)
Pre visit review using our clinic review tool, if applicable. No additional management support is needed unless otherwise documented below in the visit note. 

## 2014-04-30 NOTE — Telephone Encounter (Signed)
Relevant patient education assigned to patient using Emmi. ° °

## 2014-04-30 NOTE — Progress Notes (Signed)
Subjective:    Patient ID: Henry Owen, male    DOB: 03-Jul-1961, 53 y.o.   MRN: [id number]  Hypertension This is a chronic problem. The current episode started more than 1 year ago. The problem has been gradually worsening since onset. The problem is uncontrolled. Associated symptoms include peripheral edema. Pertinent negatives include no anxiety, blurred vision, chest pain, headaches, malaise/fatigue, neck pain, orthopnea, palpitations, PND, shortness of breath or sweats. Agents associated with hypertension include NSAIDs. Risk factors for coronary artery disease include smoking/tobacco exposure. Past treatments include angiotensin blockers. The current treatment provides mild improvement. Compliance problems include exercise and diet.       Review of Systems  Constitutional: Negative.  Negative for fever, chills, malaise/fatigue, diaphoresis, appetite change and fatigue.  HENT: Negative.   Eyes: Negative.  Negative for blurred vision.  Respiratory: Negative.  Negative for cough, choking, chest tightness and shortness of breath.   Cardiovascular: Negative.  Negative for chest pain, palpitations, orthopnea, leg swelling and PND.  Gastrointestinal: Negative.  Negative for nausea, vomiting, abdominal pain, diarrhea, constipation and blood in stool.  Endocrine: Negative.   Genitourinary: Negative.   Musculoskeletal: Negative.  Negative for arthralgias, back pain, gait problem, joint swelling, neck pain and neck stiffness.  Skin: Negative.   Allergic/Immunologic: Negative.   Neurological: Negative.  Negative for dizziness, tremors, seizures, syncope, facial asymmetry, speech difficulty, weakness, light-headedness, numbness and headaches.  Hematological: Negative.  Negative for adenopathy. Does not bruise/bleed easily.  Psychiatric/Behavioral: Negative.        Objective:   Physical Exam  Vitals reviewed. Constitutional: He is oriented to person, place, and time. He appears  well-developed and well-nourished. No distress.  HENT:  Head: Normocephalic and atraumatic.  Mouth/Throat: Oropharynx is clear and moist. No oropharyngeal exudate.  Eyes: Conjunctivae are normal. Right eye exhibits no discharge. Left eye exhibits no discharge. No scleral icterus.  Neck: Normal range of motion. Neck supple. No JVD present. No tracheal deviation present. No thyromegaly present.  Cardiovascular: Normal rate, regular rhythm, normal heart sounds and intact distal pulses.  Exam reveals no gallop and no friction rub.   No murmur heard. Pulmonary/Chest: Effort normal and breath sounds normal. No stridor. No respiratory distress. He has no wheezes. He has no rales. He exhibits no tenderness.  Abdominal: Soft. Bowel sounds are normal. He exhibits no distension and no mass. There is no tenderness. There is no rebound and no guarding. Hernia confirmed negative in the right inguinal area and confirmed negative in the left inguinal area.  Genitourinary: Rectum normal, prostate normal, testes normal and penis normal. Rectal exam shows no external hemorrhoid, no internal hemorrhoid, no fissure, no mass, no tenderness and anal tone normal. Guaiac negative stool. Prostate is not enlarged and not tender. Right testis shows no mass, no swelling and no tenderness. Right testis is descended. Left testis shows no mass, no swelling and no tenderness. Left testis is descended. Circumcised. No penile erythema or penile tenderness. No discharge found.  Musculoskeletal: Normal range of motion. He exhibits edema (trace pitting edema over both ankles). He exhibits no tenderness.  Lymphadenopathy:    He has no cervical adenopathy.       Right: No inguinal adenopathy present.       Left: No inguinal adenopathy present.  Neurological: He is oriented to person, place, and time.  Skin: Skin is warm and dry. No rash noted. He is not diaphoretic. No erythema. No pallor.  Psychiatric: He has a normal mood and affect.  His behavior  is normal. Judgment and thought content normal.     Lab Results  Component Value Date   WBC 11.3* 11/27/2011   HGB 15.7 11/27/2011   HCT 46.3 11/27/2011   PLT 186.0 11/27/2011   GLUCOSE 81 04/24/2013   CHOL 159 11/27/2011   TRIG 109.0 11/27/2011   HDL 39.70 11/27/2011   LDLCALC 98 11/27/2011   ALT 18 04/24/2013   AST 21 04/24/2013   NA 139 04/24/2013   K 4.2 04/24/2013   CL 102 04/24/2013   CREATININE 1.0 04/24/2013   BUN 9 04/24/2013   CO2 28 04/24/2013   TSH 5.50 04/24/2013   PSA 0.34 11/27/2011       Assessment & Plan:

## 2014-05-01 ENCOUNTER — Other Ambulatory Visit: Payer: Self-pay | Admitting: Internal Medicine

## 2014-05-01 LAB — COMPREHENSIVE METABOLIC PANEL
ALT: 15 U/L (ref 0–53)
AST: 20 U/L (ref 0–37)
Albumin: 4.3 g/dL (ref 3.5–5.2)
Alkaline Phosphatase: 56 U/L (ref 39–117)
BILIRUBIN TOTAL: 0.5 mg/dL (ref 0.2–1.2)
BUN: 11 mg/dL (ref 6–23)
CHLORIDE: 102 meq/L (ref 96–112)
CO2: 29 mEq/L (ref 19–32)
CREATININE: 0.95 mg/dL (ref 0.50–1.35)
Calcium: 9.5 mg/dL (ref 8.4–10.5)
Glucose, Bld: 99 mg/dL (ref 70–99)
Potassium: 4.9 mEq/L (ref 3.5–5.3)
Sodium: 136 mEq/L (ref 135–145)
Total Protein: 7.6 g/dL (ref 6.0–8.3)

## 2014-05-01 LAB — CBC WITH DIFFERENTIAL/PLATELET
Basophils Absolute: 0 10*3/uL (ref 0.0–0.1)
Basophils Relative: 0 % (ref 0–1)
EOS ABS: 0.1 10*3/uL (ref 0.0–0.7)
EOS PCT: 1 % (ref 0–5)
HCT: 44.5 % (ref 39.0–52.0)
Hemoglobin: 15.9 g/dL (ref 13.0–17.0)
Lymphocytes Relative: 17 % (ref 12–46)
Lymphs Abs: 1.6 10*3/uL (ref 0.7–4.0)
MCH: 32.2 pg (ref 26.0–34.0)
MCHC: 35.7 g/dL (ref 30.0–36.0)
MCV: 90.1 fL (ref 78.0–100.0)
Monocytes Absolute: 0.6 10*3/uL (ref 0.1–1.0)
Monocytes Relative: 7 % (ref 3–12)
Neutro Abs: 6.9 10*3/uL (ref 1.7–7.7)
Neutrophils Relative %: 75 % (ref 43–77)
PLATELETS: 204 10*3/uL (ref 150–400)
RBC: 4.94 MIL/uL (ref 4.22–5.81)
RDW: 13.1 % (ref 11.5–15.5)
WBC: 9.2 10*3/uL (ref 4.0–10.5)

## 2014-05-01 LAB — TSH: TSH: 6.873 u[IU]/mL — AB (ref 0.350–4.500)

## 2014-05-01 LAB — LIPID PANEL
CHOL/HDL RATIO: 4.8 ratio
Cholesterol: 168 mg/dL (ref 0–200)
HDL: 35 mg/dL — ABNORMAL LOW (ref >39)
LDL Cholesterol: 101 mg/dL — ABNORMAL HIGH (ref 0–99)
Triglycerides: 161 mg/dL — ABNORMAL HIGH (ref <150)
VLDL: 32 mg/dL (ref 0–40)

## 2014-05-02 ENCOUNTER — Encounter: Payer: Self-pay | Admitting: Internal Medicine

## 2014-05-02 DIAGNOSIS — E039 Hypothyroidism, unspecified: Secondary | ICD-10-CM | POA: Insufficient documentation

## 2014-05-02 LAB — URINALYSIS, ROUTINE W REFLEX MICROSCOPIC
Bilirubin Urine: NEGATIVE
Glucose, UA: NEGATIVE mg/dL
HGB URINE DIPSTICK: NEGATIVE
KETONES UR: NEGATIVE mg/dL
Leukocytes, UA: NEGATIVE
NITRITE: NEGATIVE
PH: 7.5 (ref 5.0–8.0)
Protein, ur: NEGATIVE mg/dL
Specific Gravity, Urine: <1.005 — ABNORMAL LOW (ref 1.005–1.030)
UROBILINOGEN UA: 0.2 mg/dL (ref 0.0–1.0)

## 2014-05-02 LAB — PSA: PSA: 0.4 ng/mL (ref <=4.00)

## 2014-05-02 NOTE — Assessment & Plan Note (Signed)
He does not have s/s of hypothyroidism so I will recheck this in about one month before deciding on whether or not to treat

## 2014-05-02 NOTE — Assessment & Plan Note (Signed)
Exam done Vaccines were updated Labs ordered Pt ed material was given 

## 2014-05-02 NOTE — Assessment & Plan Note (Signed)
I have asked him to quit smoking but he is not ready at this time

## 2014-05-02 NOTE — Assessment & Plan Note (Signed)
His BP is not well controlled I have asked him to change to an ARB + HCTZ combo for better BP control I will check his labs today to look for secondary causes of HTN and end organ damage

## 2014-09-13 ENCOUNTER — Telehealth: Payer: Self-pay | Admitting: *Deleted

## 2014-09-13 NOTE — Telephone Encounter (Signed)
Left msg on triage requesting lab order. Called pt back he stated at his last visit md wanted to repeat one of his labs. Per chart md wanted pt to have TSH check in 1 month. Pt was due for lab in June. He is wanting to have order sent to [facility] in [place]. # [id number]. Inform pt md is out of office until Monday will give him a call bck with his response when he returns...Johny Chess

## 2014-09-16 NOTE — Telephone Encounter (Signed)
He will have to be seen and have the lab done here

## 2014-09-17 NOTE — Telephone Encounter (Signed)
Notified pt with md response.../lmb 

## 2014-09-28 ENCOUNTER — Encounter: Payer: Self-pay | Admitting: Internal Medicine

## 2014-09-28 ENCOUNTER — Other Ambulatory Visit: Payer: Self-pay | Admitting: Internal Medicine

## 2014-09-28 ENCOUNTER — Ambulatory Visit (INDEPENDENT_AMBULATORY_CARE_PROVIDER_SITE_OTHER): Payer: Managed Care, Other (non HMO) | Admitting: Internal Medicine

## 2014-09-28 VITALS — BP 112/80 | HR 76 | Temp 98.3°F | Resp 16 | Ht 72.0 in | Wt 219.0 lb

## 2014-09-28 DIAGNOSIS — I10 Essential (primary) hypertension: Secondary | ICD-10-CM

## 2014-09-28 DIAGNOSIS — R946 Abnormal results of thyroid function studies: Secondary | ICD-10-CM

## 2014-09-28 DIAGNOSIS — R7989 Other specified abnormal findings of blood chemistry: Secondary | ICD-10-CM

## 2014-09-28 LAB — T3, FREE: T3 FREE: 3.8 pg/mL (ref 2.3–4.2)

## 2014-09-28 LAB — T4, FREE: FREE T4: 0.96 ng/dL (ref 0.80–1.80)

## 2014-09-28 LAB — BASIC METABOLIC PANEL
BUN: 12 mg/dL (ref 6–23)
CALCIUM: 9.4 mg/dL (ref 8.4–10.5)
CO2: 27 mEq/L (ref 19–32)
CREATININE: 0.96 mg/dL (ref 0.50–1.35)
Chloride: 103 mEq/L (ref 96–112)
Glucose, Bld: 97 mg/dL (ref 70–99)
Potassium: 4.3 mEq/L (ref 3.5–5.3)
Sodium: 137 mEq/L (ref 135–145)

## 2014-09-28 LAB — TSH: TSH: 6.202 u[IU]/mL — AB (ref 0.350–4.500)

## 2014-09-28 MED ORDER — LEVOTHYROXINE SODIUM 50 MCG PO TABS: 50.0000 ug | ORAL_TABLET | Freq: Every day | ORAL | Status: DC

## 2014-09-28 NOTE — Addendum Note (Signed)
Addended by: Janith Lima on: 09/28/2014 02:41 PM   Modules accepted: Orders

## 2014-09-28 NOTE — Assessment & Plan Note (Signed)
His BP is well controlled I will check his lytes and renal function today 

## 2014-09-28 NOTE — Assessment & Plan Note (Signed)
I will recheck his TFT's If TSH continues to go up then will start levo-T

## 2014-09-28 NOTE — Patient Instructions (Signed)

## 2014-09-28 NOTE — Progress Notes (Signed)
   Subjective:    Patient ID: Henry Owen, male    DOB: [birth year], 53 y.o.   MRN: [id number]  Hypertension This is a chronic problem. The current episode started more than 1 year ago. The problem has been gradually improving since onset. The problem is controlled. Associated symptoms include malaise/fatigue. Pertinent negatives include no anxiety, blurred vision, chest pain, headaches, neck pain, orthopnea, palpitations, peripheral edema, PND, shortness of breath or sweats. Past treatments include angiotensin blockers and diuretics. The current treatment provides significant improvement. There are no compliance problems.       Review of Systems  Constitutional: Positive for malaise/fatigue and fatigue. Negative for fever, chills, diaphoresis, appetite change and unexpected weight change.  HENT: Negative.   Eyes: Negative.  Negative for blurred vision.  Respiratory: Negative.  Negative for cough, choking, chest tightness, shortness of breath, wheezing and stridor.   Cardiovascular: Negative.  Negative for chest pain, palpitations, orthopnea, leg swelling and PND.  Gastrointestinal: Negative.  Negative for nausea, vomiting, abdominal pain, diarrhea, constipation and blood in stool.  Endocrine: Negative.   Genitourinary: Negative.   Musculoskeletal: Negative.  Negative for arthralgias, back pain, joint swelling, neck pain and neck stiffness.  Skin: Negative.  Negative for rash.  Allergic/Immunologic: Negative.   Neurological: Negative.  Negative for dizziness, tremors, seizures, syncope, facial asymmetry, speech difficulty, weakness, light-headedness, numbness and headaches.  Hematological: Negative.  Negative for adenopathy. Does not bruise/bleed easily.  Psychiatric/Behavioral: Negative.        Objective:   Physical Exam  Vitals reviewed. Constitutional: He is oriented to person, place, and time. He appears well-developed and well-nourished. No distress.  HENT:  Head: Normocephalic  and atraumatic.  Mouth/Throat: Oropharynx is clear and moist. No oropharyngeal exudate.  Eyes: Conjunctivae are normal. Right eye exhibits no discharge. Left eye exhibits no discharge. No scleral icterus.  Neck: Normal range of motion. Neck supple. No JVD present. No tracheal deviation present. No thyromegaly present.  Cardiovascular: Normal rate, regular rhythm, normal heart sounds and intact distal pulses.  Exam reveals no gallop and no friction rub.   No murmur heard. Pulmonary/Chest: Effort normal and breath sounds normal. No stridor. No respiratory distress. He has no wheezes. He has no rales. He exhibits no tenderness.  Abdominal: Soft. Bowel sounds are normal. He exhibits no distension and no mass. There is no tenderness. There is no rebound and no guarding.  Musculoskeletal: Normal range of motion. He exhibits no edema and no tenderness.  Lymphadenopathy:    He has no cervical adenopathy.  Neurological: He is oriented to person, place, and time.  Skin: Skin is warm and dry. No rash noted. He is not diaphoretic. No erythema. No pallor.     Lab Results  Component Value Date   WBC 9.2 05/01/2014   HGB 15.9 05/01/2014   HCT 44.5 05/01/2014   PLT 204 05/01/2014   GLUCOSE 99 05/01/2014   CHOL 168 05/01/2014   TRIG 161* 05/01/2014   HDL 35* 05/01/2014   LDLCALC 101* 05/01/2014   ALT 15 05/01/2014   AST 20 05/01/2014   NA 136 05/01/2014   K 4.9 05/01/2014   CL 102 05/01/2014   CREATININE 0.95 05/01/2014   BUN 11 05/01/2014   CO2 29 05/01/2014   TSH 6.873* 05/01/2014   PSA 0.40 05/01/2014       Assessment & Plan:

## 2014-10-01 ENCOUNTER — Telehealth: Payer: Self-pay | Admitting: Internal Medicine

## 2014-10-01 NOTE — Telephone Encounter (Signed)
emmi emailed °

## 2014-10-23 ENCOUNTER — Other Ambulatory Visit: Payer: Self-pay | Admitting: Internal Medicine

## 2014-11-30 ENCOUNTER — Encounter: Payer: Self-pay | Admitting: Cardiology

## 2014-12-24 ENCOUNTER — Other Ambulatory Visit: Payer: Self-pay | Admitting: Internal Medicine

## 2014-12-24 ENCOUNTER — Telehealth: Payer: Self-pay | Admitting: Internal Medicine

## 2014-12-24 DIAGNOSIS — R7989 Other specified abnormal findings of blood chemistry: Secondary | ICD-10-CM

## 2014-12-24 LAB — TSH: TSH: 1.662 u[IU]/mL (ref 0.350–4.500)

## 2014-12-24 NOTE — Telephone Encounter (Signed)
Pt sched for f/u appt 1/14 w/PCP to check thyroid. Pt requesting thyroid lab be done prior, at [facility] as his insurance will not cover labs at [facility], pt wanted me to ask if this could happen and send a note.   [phone]

## 2014-12-24 NOTE — Telephone Encounter (Signed)
Order for TSH at [facility] written

## 2014-12-24 NOTE — Telephone Encounter (Signed)
Pt notified and request order faxed to [facility] on [address] [phone]

## 2014-12-25 ENCOUNTER — Encounter: Payer: Self-pay | Admitting: Internal Medicine

## 2014-12-27 ENCOUNTER — Ambulatory Visit (INDEPENDENT_AMBULATORY_CARE_PROVIDER_SITE_OTHER): Payer: Managed Care, Other (non HMO) | Admitting: Internal Medicine

## 2014-12-27 ENCOUNTER — Encounter: Payer: Self-pay | Admitting: Internal Medicine

## 2014-12-27 VITALS — BP 122/86 | HR 69 | Temp 98.3°F | Resp 16 | Ht 72.0 in | Wt 218.8 lb

## 2014-12-27 DIAGNOSIS — E038 Other specified hypothyroidism: Secondary | ICD-10-CM

## 2014-12-27 DIAGNOSIS — I10 Essential (primary) hypertension: Secondary | ICD-10-CM

## 2014-12-27 MED ORDER — VALSARTAN 320 MG PO TABS: 320.0000 mg | ORAL_TABLET | Freq: Every day | ORAL | Status: DC

## 2014-12-27 NOTE — Patient Instructions (Signed)

## 2014-12-27 NOTE — Progress Notes (Signed)
Pre visit review using our clinic review tool, if applicable. No additional management support is needed unless otherwise documented below in the visit note. 

## 2014-12-27 NOTE — Assessment & Plan Note (Signed)
His TSH is in the normal range so will stay on the current dose of synthroid

## 2014-12-27 NOTE — Assessment & Plan Note (Signed)
He has some symptoms that suggest that his BP may be too low at times and he wants to stop the HCTZ because it makes him feel "dehydrated" Will stop the HCTZ and will cont the ARB

## 2014-12-27 NOTE — Progress Notes (Signed)
   Subjective:    Patient ID: Henry Owen, male    DOB: [birth year], 54 y.o.   MRN: [id number]  Hypertension This is a chronic problem. The current episode started more than 1 year ago. The problem has been rapidly improving since onset. Associated symptoms include malaise/fatigue. Pertinent negatives include no anxiety, blurred vision, chest pain, headaches, neck pain, orthopnea, palpitations, peripheral edema, PND, shortness of breath or sweats. Risk factors for coronary artery disease include smoking/tobacco exposure. Past treatments include angiotensin blockers and diuretics. The current treatment provides moderate improvement. There are no compliance problems.       Review of Systems  Constitutional: Positive for malaise/fatigue. Negative for fever, chills, diaphoresis, appetite change and fatigue.  HENT: Negative.   Eyes: Negative.  Negative for blurred vision.  Respiratory: Negative.  Negative for cough, choking, chest tightness and shortness of breath.   Cardiovascular: Negative.  Negative for chest pain, palpitations, orthopnea, leg swelling and PND.  Gastrointestinal: Negative.  Negative for nausea, vomiting, abdominal pain, diarrhea, constipation and blood in stool.  Endocrine: Negative.   Genitourinary: Negative.   Musculoskeletal: Negative.  Negative for neck pain.  Skin: Negative.   Allergic/Immunologic: Negative.   Neurological: Positive for dizziness and light-headedness. Negative for tremors, seizures, syncope, facial asymmetry, speech difficulty, weakness, numbness and headaches.  Hematological: Negative.  Negative for adenopathy. Does not bruise/bleed easily.  Psychiatric/Behavioral: Negative.        Objective:   Physical Exam  Constitutional: He is oriented to person, place, and time. He appears well-developed and well-nourished. No distress.  HENT:  Head: Normocephalic and atraumatic.  Mouth/Throat: Oropharynx is clear and moist. No oropharyngeal exudate.    Eyes: Conjunctivae are normal. Right eye exhibits no discharge. Left eye exhibits no discharge. No scleral icterus.  Neck: Normal range of motion. Neck supple. No JVD present. No tracheal deviation present. No thyromegaly present.  Cardiovascular: Normal rate, regular rhythm, normal heart sounds and intact distal pulses.  Exam reveals no gallop and no friction rub.   No murmur heard. Pulmonary/Chest: Effort normal and breath sounds normal. No stridor. No respiratory distress. He has no wheezes. He has no rales. He exhibits no tenderness.  Abdominal: Soft. Bowel sounds are normal. He exhibits no distension and no mass. There is no tenderness. There is no rebound and no guarding.  Musculoskeletal: Normal range of motion. He exhibits no edema or tenderness.  Lymphadenopathy:    He has no cervical adenopathy.  Neurological: He is oriented to person, place, and time.  Skin: Skin is warm and dry. No rash noted. He is not diaphoretic. No erythema. No pallor.  Vitals reviewed.    Lab Results  Component Value Date   WBC 9.2 05/01/2014   HGB 15.9 05/01/2014   HCT 44.5 05/01/2014   PLT 204 05/01/2014   GLUCOSE 97 09/28/2014   CHOL 168 05/01/2014   TRIG 161* 05/01/2014   HDL 35* 05/01/2014   LDLCALC 101* 05/01/2014   ALT 15 05/01/2014   AST 20 05/01/2014   NA 137 09/28/2014   K 4.3 09/28/2014   CL 103 09/28/2014   CREATININE 0.96 09/28/2014   BUN 12 09/28/2014   CO2 27 09/28/2014   TSH 1.662 12/24/2014   PSA 0.40 05/01/2014       Assessment & Plan:

## 2015-03-24 ENCOUNTER — Other Ambulatory Visit: Payer: Self-pay | Admitting: Internal Medicine

## 2015-06-05 ENCOUNTER — Ambulatory Visit (INDEPENDENT_AMBULATORY_CARE_PROVIDER_SITE_OTHER): Payer: Managed Care, Other (non HMO) | Admitting: Internal Medicine

## 2015-06-05 VITALS — BP 124/82 | HR 72 | Temp 98.7°F | Resp 16 | Ht 72.0 in | Wt 213.0 lb

## 2015-06-05 DIAGNOSIS — I10 Essential (primary) hypertension: Secondary | ICD-10-CM

## 2015-06-05 DIAGNOSIS — E038 Other specified hypothyroidism: Secondary | ICD-10-CM | POA: Diagnosis not present

## 2015-06-05 NOTE — Patient Instructions (Signed)
Hypothyroidism The thyroid is a large gland located in the lower front of your neck. The thyroid gland helps control metabolism. Metabolism is how your body handles food. It controls metabolism with the hormone thyroxine. When this gland is underactive (hypothyroid), it produces too little hormone.  CAUSES These include:   Absence or destruction of thyroid tissue.  Goiter due to iodine deficiency.  Goiter due to medications.  Congenital defects (since birth).  Problems with the pituitary. This causes a lack of TSH (thyroid stimulating hormone). This hormone tells the thyroid to turn out more hormone. SYMPTOMS  Lethargy (feeling as though you have no energy)  Cold intolerance  Weight gain (in spite of normal food intake)  Dry skin  Coarse hair  Menstrual irregularity (if severe, may lead to infertility)  Slowing of thought processes Cardiac problems are also caused by insufficient amounts of thyroid hormone. Hypothyroidism in the newborn is cretinism, and is an extreme form. It is important that this form be treated adequately and immediately or it will lead rapidly to retarded physical and mental development. DIAGNOSIS  To prove hypothyroidism, your caregiver may do blood tests and ultrasound tests. Sometimes the signs are hidden. It may be necessary for your caregiver to watch this illness with blood tests either before or after diagnosis and treatment. TREATMENT  Low levels of thyroid hormone are increased by using synthetic thyroid hormone. This is a safe, effective treatment. It usually takes about four weeks to gain the full effects of the medication. After you have the full effect of the medication, it will generally take another four weeks for problems to leave. Your caregiver may start you on low doses. If you have had heart problems the dose may be gradually increased. It is generally not an emergency to get rapidly to normal. HOME CARE INSTRUCTIONS   Take your  medications as your caregiver suggests. Let your caregiver know of any medications you are taking or start taking. Your caregiver will help you with dosage schedules.  As your condition improves, your dosage needs may increase. It will be necessary to have continuing blood tests as suggested by your caregiver.  Report all suspected medication side effects to your caregiver. SEEK MEDICAL CARE IF: Seek medical care if you develop:  Sweating.  Tremulousness (tremors).  Anxiety.  Rapid weight loss.  Heat intolerance.  Emotional swings.  Diarrhea.  Weakness. SEEK IMMEDIATE MEDICAL CARE IF:  You develop chest pain, an irregular heart beat (palpitations), or a rapid heart beat. MAKE SURE YOU:   Understand these instructions.  Will watch your condition.  Will get help right away if you are not doing well or get worse. Document Released: 11/30/2005 Document Revised: 02/22/2012 Document Reviewed: 07/20/2008 ExitCare Patient Information 2015 [employer]. This information is not intended to replace advice given to you by your health care provider. Make sure you discuss any questions you have with your health care provider.  

## 2015-06-05 NOTE — Progress Notes (Signed)
Pre visit review using our clinic review tool, if applicable. No additional management support is needed unless otherwise documented below in the visit note. 

## 2015-06-06 ENCOUNTER — Other Ambulatory Visit: Payer: Self-pay | Admitting: Internal Medicine

## 2015-06-06 ENCOUNTER — Ambulatory Visit: Payer: Managed Care, Other (non HMO) | Admitting: Internal Medicine

## 2015-06-06 ENCOUNTER — Encounter: Payer: Self-pay | Admitting: Internal Medicine

## 2015-06-06 LAB — BASIC METABOLIC PANEL
BUN: 14 mg/dL (ref 6–23)
CO2: 28 mEq/L (ref 19–32)
CREATININE: 1.03 mg/dL (ref 0.50–1.35)
Calcium: 9.7 mg/dL (ref 8.4–10.5)
Chloride: 102 mEq/L (ref 96–112)
Glucose, Bld: 104 mg/dL — ABNORMAL HIGH (ref 70–99)
Potassium: 4.8 mEq/L (ref 3.5–5.3)
Sodium: 140 mEq/L (ref 135–145)

## 2015-06-06 LAB — TSH: TSH: 4.139 u[IU]/mL (ref 0.350–4.500)

## 2015-06-06 NOTE — Progress Notes (Signed)
Subjective:  Patient ID: Henry Owen, male    DOB: [birth year]  Age: 54 y.o. MRN: [id number]  CC: Hypertension and Hypothyroidism   HPI Henry Owen presents for follow up - he feels well and offers no complaints.  Outpatient Prescriptions Prior to Visit  Medication Sig Dispense Refill  . ibuprofen (ADVIL,MOTRIN) 100 MG tablet Take 100 mg by mouth every 6 (six) hours as needed.      Marland Kitchen levothyroxine (SYNTHROID, LEVOTHROID) 50 MCG tablet TAKE 1 TABLET BY MOUTH EVERY DAY 90 tablet 1  . valsartan (DIOVAN) 320 MG tablet Take 1 tablet (320 mg total) by mouth daily. 90 tablet 3  . levothyroxine (SYNTHROID, LEVOTHROID) 50 MCG tablet TAKE 1 TABLET BY MOUTH EVERY DAY (Patient not taking: Reported on 06/05/2015) 90 tablet 1   No facility-administered medications prior to visit.    ROS Review of Systems  Constitutional: Negative.  Negative for fever, chills, diaphoresis, appetite change and fatigue.  HENT: Negative.   Eyes: Negative.   Respiratory: Negative.  Negative for cough, choking, chest tightness, shortness of breath and stridor.   Cardiovascular: Negative.  Negative for chest pain, palpitations and leg swelling.  Gastrointestinal: Negative.  Negative for nausea, vomiting, abdominal pain, diarrhea, constipation and blood in stool.  Endocrine: Negative.   Genitourinary: Negative.   Musculoskeletal: Negative.   Skin: Negative.   Allergic/Immunologic: Negative.   Neurological: Negative.  Negative for dizziness, syncope, speech difficulty, weakness, light-headedness, numbness and headaches.  Hematological: Negative.  Negative for adenopathy. Does not bruise/bleed easily.  Psychiatric/Behavioral: Negative.     Objective:  BP 124/82 mmHg  Pulse 72  Temp(Src) 98.7 F (37.1 C) (Oral)  Ht 6' (1.829 m)  Wt 213 lb (96.616 kg)  BMI 28.88 kg/m2  SpO2 96%  BP Readings from Last 3 Encounters:  06/05/15 124/82  12/27/14 122/86  09/28/14 112/80    Wt Readings from Last 3  Encounters:  06/05/15 213 lb (96.616 kg)  12/27/14 218 lb 12 oz (99.224 kg)  09/28/14 219 lb (99.338 kg)    Physical Exam  Constitutional: He is oriented to person, place, and time. He appears well-developed and well-nourished. No distress.  HENT:  Head: Normocephalic and atraumatic.  Mouth/Throat: Oropharynx is clear and moist. No oropharyngeal exudate.  Eyes: Conjunctivae are normal. Right eye exhibits no discharge. Left eye exhibits no discharge. No scleral icterus.  Neck: Normal range of motion. Neck supple. No JVD present. No tracheal deviation present. No thyromegaly present.  Cardiovascular: Normal rate, regular rhythm, normal heart sounds and intact distal pulses.  Exam reveals no gallop and no friction rub.   No murmur heard. Pulmonary/Chest: Effort normal and breath sounds normal. No stridor. No respiratory distress. He has no wheezes. He has no rales. He exhibits no tenderness.  Abdominal: Soft. Bowel sounds are normal. He exhibits no distension and no mass. There is no tenderness. There is no rebound and no guarding.  Musculoskeletal: Normal range of motion. He exhibits no edema or tenderness.  Lymphadenopathy:    He has no cervical adenopathy.  Neurological: He is oriented to person, place, and time.  Skin: Skin is warm and dry. No rash noted. He is not diaphoretic. No erythema. No pallor.  Vitals reviewed.   Lab Results  Component Value Date   WBC 9.2 05/01/2014   HGB 15.9 05/01/2014   HCT 44.5 05/01/2014   PLT 204 05/01/2014   GLUCOSE 104* 06/06/2015   CHOL 168 05/01/2014   TRIG 161* 05/01/2014   HDL 35* 05/01/2014  LDLCALC 101* 05/01/2014   ALT 15 05/01/2014   AST 20 05/01/2014   NA 140 06/06/2015   K 4.8 06/06/2015   CL 102 06/06/2015   CREATININE 1.03 06/06/2015   BUN 14 06/06/2015   CO2 28 06/06/2015   TSH 4.139 06/06/2015   PSA 0.40 05/01/2014    No results found.  Assessment & Plan:   Henry Owen was seen today for hypertension and  hypothyroidism.  Diagnoses and all orders for this visit:  Essential hypertension, benign - his BP is well controlled, lytes and renal function are stable Orders: -     Basic metabolic panel  Other specified hypothyroidism - his TSh is in the normal range, will remain on the current synthroid dose Orders: -     TSH   I am having Henry Owen maintain his ibuprofen, valsartan, and levothyroxine.  No orders of the defined types were placed in this encounter.     Follow-up: Return in about 6 months (around 12/05/2015).  Henry Calico, MD

## 2015-09-25 ENCOUNTER — Other Ambulatory Visit: Payer: Self-pay

## 2015-09-25 MED ORDER — LEVOTHYROXINE SODIUM 50 MCG PO TABS: 50.0000 ug | ORAL_TABLET | Freq: Every day | ORAL | Status: DC

## 2015-12-03 ENCOUNTER — Other Ambulatory Visit: Payer: Self-pay | Admitting: Internal Medicine

## 2016-03-02 ENCOUNTER — Other Ambulatory Visit: Payer: Self-pay | Admitting: Internal Medicine

## 2016-03-03 ENCOUNTER — Other Ambulatory Visit: Payer: Self-pay | Admitting: Internal Medicine

## 2016-09-02 ENCOUNTER — Other Ambulatory Visit: Payer: Self-pay | Admitting: Internal Medicine

## 2016-09-02 NOTE — Telephone Encounter (Signed)
LVM for pt to call back as soon as possible.   RE: only a 30 day supply was sent of the valsartan. Pt needs an appt for any additional refills.

## 2016-09-03 ENCOUNTER — Telehealth: Payer: Self-pay | Admitting: Emergency Medicine

## 2016-09-03 NOTE — Telephone Encounter (Signed)
Pt wants to know if he can have his labs put in before his CPE on 10/19. He is going to have the labs done at [facility]  in [place]. Please advise thanks.

## 2016-09-04 NOTE — Telephone Encounter (Signed)
Are you typically okay with labs prior to appt?  Which labs would you like to order? Can enter them and print req to send to [facility] per pt rq.

## 2016-09-07 ENCOUNTER — Other Ambulatory Visit: Payer: Self-pay | Admitting: Internal Medicine

## 2016-09-07 DIAGNOSIS — Z0000 Encounter for general adult medical examination without abnormal findings: Secondary | ICD-10-CM

## 2016-09-07 NOTE — Telephone Encounter (Signed)
Labs ordered.

## 2016-09-08 NOTE — Telephone Encounter (Signed)
Left detailed message for pt:  Labs have been entered.

## 2016-09-08 NOTE — Telephone Encounter (Signed)
Orders faxed

## 2016-09-08 NOTE — Telephone Encounter (Signed)
Fax number to [facility] is [phone]

## 2016-09-08 NOTE — Progress Notes (Signed)
Reordered labs for [facility] to draw.

## 2016-09-18 NOTE — Telephone Encounter (Signed)
Error

## 2016-09-22 ENCOUNTER — Other Ambulatory Visit: Payer: Self-pay | Admitting: Internal Medicine

## 2016-09-22 LAB — CBC WITH DIFFERENTIAL/PLATELET
BASOS ABS: 0 {cells}/uL (ref 0–200)
BASOS PCT: 0 %
EOS PCT: 2 %
Eosinophils Absolute: 160 cells/uL (ref 15–500)
HCT: 44.2 % (ref 38.5–50.0)
HEMOGLOBIN: 15.1 g/dL (ref 13.2–17.1)
LYMPHS ABS: 1600 {cells}/uL (ref 850–3900)
Lymphocytes Relative: 20 %
MCH: 31.2 pg (ref 27.0–33.0)
MCHC: 34.2 g/dL (ref 32.0–36.0)
MCV: 91.3 fL (ref 80.0–100.0)
MPV: 9.9 fL (ref 7.5–12.5)
Monocytes Absolute: 640 cells/uL (ref 200–950)
Monocytes Relative: 8 %
NEUTROS ABS: 5600 {cells}/uL (ref 1500–7800)
Neutrophils Relative %: 70 %
PLATELETS: 182 10*3/uL (ref 140–400)
RBC: 4.84 MIL/uL (ref 4.20–5.80)
RDW: 13.2 % (ref 11.0–15.0)
WBC: 8 10*3/uL (ref 3.8–10.8)

## 2016-09-22 LAB — LIPID PANEL
CHOL/HDL RATIO: 5.3 ratio — AB (ref <=5.0)
CHOLESTEROL: 163 mg/dL (ref 125–200)
HDL: 31 mg/dL — AB (ref >=40)
LDL Cholesterol: 96 mg/dL (ref <130)
Triglycerides: 179 mg/dL — ABNORMAL HIGH (ref <150)
VLDL: 36 mg/dL — AB (ref <30)

## 2016-09-22 LAB — PSA: PSA: 0.3 ng/mL (ref <=4.0)

## 2016-09-22 LAB — COMPREHENSIVE METABOLIC PANEL WITH GFR
ALT: 17 U/L (ref 9–46)
AST: 20 U/L (ref 10–35)
Albumin: 3.7 g/dL (ref 3.6–5.1)
Alkaline Phosphatase: 55 U/L (ref 40–115)
BUN: 14 mg/dL (ref 7–25)
CO2: 23 mmol/L (ref 20–31)
Calcium: 8.8 mg/dL (ref 8.6–10.3)
Chloride: 106 mmol/L (ref 98–110)
Creat: 0.9 mg/dL (ref 0.70–1.33)
Glucose, Bld: 92 mg/dL (ref 65–99)
Potassium: 4.2 mmol/L (ref 3.5–5.3)
Sodium: 138 mmol/L (ref 135–146)
Total Bilirubin: 0.4 mg/dL (ref 0.2–1.2)
Total Protein: 6.9 g/dL (ref 6.1–8.1)

## 2016-09-22 LAB — TSH: TSH: 5.86 m[IU]/L — AB (ref 0.40–4.50)

## 2016-09-23 LAB — HEPATITIS C ANTIBODY: HCV AB: NEGATIVE

## 2016-09-23 LAB — HIV ANTIBODY (ROUTINE TESTING W REFLEX): HIV 1&2 Ab, 4th Generation: NONREACTIVE

## 2016-09-29 ENCOUNTER — Encounter: Payer: Self-pay | Admitting: Internal Medicine

## 2016-10-01 ENCOUNTER — Encounter: Payer: Self-pay | Admitting: Internal Medicine

## 2016-10-01 ENCOUNTER — Ambulatory Visit (INDEPENDENT_AMBULATORY_CARE_PROVIDER_SITE_OTHER): Payer: Managed Care, Other (non HMO) | Admitting: Internal Medicine

## 2016-10-01 VITALS — BP 142/88 | HR 80 | Temp 98.6°F | Resp 16 | Ht 72.0 in | Wt 222.0 lb

## 2016-10-01 DIAGNOSIS — E785 Hyperlipidemia, unspecified: Secondary | ICD-10-CM

## 2016-10-01 DIAGNOSIS — I10 Essential (primary) hypertension: Secondary | ICD-10-CM | POA: Diagnosis not present

## 2016-10-01 DIAGNOSIS — Z0000 Encounter for general adult medical examination without abnormal findings: Secondary | ICD-10-CM | POA: Diagnosis not present

## 2016-10-01 DIAGNOSIS — N401 Enlarged prostate with lower urinary tract symptoms: Secondary | ICD-10-CM

## 2016-10-01 DIAGNOSIS — E039 Hypothyroidism, unspecified: Secondary | ICD-10-CM

## 2016-10-01 DIAGNOSIS — N138 Other obstructive and reflux uropathy: Secondary | ICD-10-CM | POA: Insufficient documentation

## 2016-10-01 MED ORDER — ASPIRIN EC 81 MG PO TBEC: 81.0000 mg | DELAYED_RELEASE_TABLET | Freq: Every day | ORAL | 3 refills | Status: DC

## 2016-10-01 MED ORDER — ROSUVASTATIN CALCIUM 5 MG PO TABS: 5.0000 mg | ORAL_TABLET | Freq: Every day | ORAL | 3 refills | Status: DC

## 2016-10-01 MED ORDER — SILODOSIN 8 MG PO CAPS: 8.0000 mg | ORAL_CAPSULE | Freq: Every day | ORAL | 3 refills | Status: DC

## 2016-10-01 MED ORDER — LEVOTHYROXINE SODIUM 75 MCG PO TABS: 75.0000 ug | ORAL_TABLET | Freq: Every day | ORAL | 1 refills | Status: DC

## 2016-10-01 NOTE — Progress Notes (Signed)
Pre visit review using our clinic review tool, if applicable. No additional management support is needed unless otherwise documented below in the visit note. 

## 2016-10-01 NOTE — Patient Instructions (Signed)

## 2016-10-01 NOTE — Progress Notes (Signed)
Subjective:  Patient ID: Henry Owen, male    DOB: 12/27/1960  Age: 55 y.o. MRN: [id number]  CC: Annual Exam; Hypertension; and Hypothyroidism   HPI [name] presents for a CPX.  He complains of fatigue and weight gain. He tells me his blood pressure has been well controlled and he has had no recent episodes of headache, blurred vision, chest pain, shortness of breath, palpitations, edema, or syncope.  Outpatient Medications Prior to Visit  Medication Sig Dispense Refill  . valsartan (DIOVAN) 320 MG tablet Take 1 tablet (320 mg total) by mouth daily. 30 tablet 0  . ibuprofen (ADVIL,MOTRIN) 100 MG tablet Take 100 mg by mouth every 6 (six) hours as needed.      Marland Kitchen levothyroxine (SYNTHROID, LEVOTHROID) 50 MCG tablet Take 1 tablet (50 mcg total) by mouth daily before breakfast. appt needed for refills 90 tablet 1   No facility-administered medications prior to visit.     ROS Review of Systems  Constitutional: Positive for fatigue. Negative for activity change, appetite change, diaphoresis and unexpected weight change.  HENT: Negative.  Negative for trouble swallowing and voice change.   Eyes: Negative.  Negative for visual disturbance.  Respiratory: Negative for cough, choking, chest tightness, shortness of breath and stridor.   Cardiovascular: Negative.  Negative for chest pain, palpitations and leg swelling.  Gastrointestinal: Negative for abdominal pain, blood in stool, constipation, diarrhea and nausea.  Endocrine: Negative.  Negative for cold intolerance and heat intolerance.  Genitourinary: Positive for difficulty urinating. Negative for dysuria, frequency, hematuria, penile swelling, scrotal swelling, testicular pain and urgency.       He complains of weak urinary stream, straining, hesitation, and dribbling  Musculoskeletal: Negative.  Negative for arthralgias, back pain, neck pain and neck stiffness.  Skin: Negative.   Allergic/Immunologic: Negative.   Neurological:  Negative.  Negative for dizziness, tremors, weakness, numbness and headaches.  Hematological: Negative.  Negative for adenopathy. Does not bruise/bleed easily.  Psychiatric/Behavioral: Negative.     Objective:  BP (!) 142/88 (BP Location: Left Arm, Patient Position: Sitting, Cuff Size: Normal)   Pulse 80   Temp 98.6 F (37 C) (Oral)   Resp 16   Ht 6' (1.829 m)   Wt 222 lb (100.7 kg)   SpO2 98%   BMI 30.11 kg/m   BP Readings from Last 3 Encounters:  10/01/16 (!) 142/88  06/05/15 124/82  12/27/14 122/86    Wt Readings from Last 3 Encounters:  10/01/16 222 lb (100.7 kg)  06/05/15 213 lb (96.6 kg)  12/27/14 218 lb 12 oz (99.2 kg)    Physical Exam  Constitutional: He is oriented to person, place, and time. He appears well-developed and well-nourished. No distress.  HENT:  Head: Normocephalic and atraumatic.  Mouth/Throat: Oropharynx is clear and moist. No oropharyngeal exudate.  Eyes: Conjunctivae are normal. Right eye exhibits no discharge. Left eye exhibits no discharge. No scleral icterus.  Neck: Normal range of motion. Neck supple. No JVD present. No tracheal deviation present. No thyromegaly present.  Cardiovascular: Normal rate, regular rhythm, S1 normal, S2 normal, normal heart sounds and intact distal pulses.  Exam reveals no gallop and no friction rub.   No murmur heard. Pulses:      Carotid pulses are 1+ on the right side, and 1+ on the left side.      Radial pulses are 1+ on the right side, and 1+ on the left side.       Femoral pulses are 1+ on the right side,  and 1+ on the left side.      Popliteal pulses are 1+ on the right side, and 1+ on the left side.       Dorsalis pedis pulses are 1+ on the right side, and 1+ on the left side.       Posterior tibial pulses are 1+ on the right side, and 1+ on the left side.  EKG ---  Sinus  Rhythm  Low voltage in limb leads.   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   ABNORMAL - c/w hx of heavy tobacco abuse, no ischemia or LVH noted  Pulmonary/Chest: Effort normal and breath sounds normal. No stridor. No respiratory distress. He has no wheezes. He has no rales. He exhibits no tenderness.  Abdominal: Soft. Bowel sounds are normal. He exhibits no distension and no mass. There is no tenderness. There is no rebound and no guarding. Hernia confirmed negative in the right inguinal area and confirmed negative in the left inguinal area.  Genitourinary: Rectum normal, testes normal and penis normal. Rectal exam shows no external hemorrhoid, no internal hemorrhoid, no fissure, no mass, no tenderness and guaiac negative stool. Prostate is enlarged (2+ smooth symm BPH). Prostate is not tender. Right testis shows no mass, no swelling and no tenderness. Right testis is descended. Left testis shows no mass, no swelling and no tenderness. Left testis is descended. Circumcised. No penile erythema or penile tenderness. No discharge found.  Musculoskeletal: Normal range of motion. He exhibits no edema, tenderness or deformity.  Lymphadenopathy:    He has no cervical adenopathy.       Right: No inguinal adenopathy present.       Left: No inguinal adenopathy present.  Neurological: He is oriented to person, place, and time.  Skin: Skin is warm and dry. No rash noted. He is not diaphoretic. No erythema. No pallor.  Psychiatric: He has a normal mood and affect. His behavior is normal. Judgment and thought content normal.  Vitals reviewed.   Lab Results  Component Value Date   WBC 8.0 09/22/2016   HGB 15.1 09/22/2016   HCT 44.2 09/22/2016   PLT 182 09/22/2016   GLUCOSE 92 09/22/2016   CHOL 163 09/22/2016   TRIG 179 (H) 09/22/2016   HDL 31 (L) 09/22/2016   LDLCALC 96 09/22/2016   ALT 17 09/22/2016   AST 20 09/22/2016   NA 138 09/22/2016   K 4.2 09/22/2016   CL 106 09/22/2016   CREATININE 0.90 09/22/2016   BUN 14 09/22/2016   CO2 23 09/22/2016   TSH  5.86 (H) 09/22/2016   PSA 0.3 09/22/2016    No results found.  Assessment & Plan:   Brentyn was seen today for annual exam, hypertension and hypothyroidism.  Diagnoses and all orders for this visit:  Hypothyroidism, unspecified type- his TSH is elevated at 5.86 and he is symptomatic, I have recommended that he increase the dose of his levothyroxine. -     levothyroxine (SYNTHROID, LEVOTHROID) 75 MCG tablet; Take 1 tablet (75 mcg total) by mouth daily.  BPH with obstruction/lower urinary tract symptoms- will treat with a peripheral alpha blocker -     silodosin (RAPAFLO) 8 MG CAPS capsule; Take 1 capsule (8 mg total) by mouth daily with breakfast.  Routine general medical examination at a health care facility- exam completed, labs ordered and reviewed, vaccines reviewed and updated, his colonoscopy is up-to-date, patient education material was given.  Hyperlipidemia LDL  goal <70- his Framingham risk score is over 30% so I have asked him to start aspirin and statin therapy for cardiovascular risk reduction. -     aspirin EC 81 MG tablet; Take 1 tablet (81 mg total) by mouth daily. -     rosuvastatin (CRESTOR) 5 MG tablet; Take 1 tablet (5 mg total) by mouth daily.  Hypertension, unspecified type- his blood pressure is well-controlled. Lites and renal function are stable. His EKG is negative for LVH or ischemia. -     EKG 12-Lead   I have discontinued Mr. Luby ibuprofen and levothyroxine. I am also having him start on levothyroxine, silodosin, aspirin EC, and rosuvastatin. Additionally, I am having him maintain his valsartan and cetirizine.  Meds ordered this encounter  Medications  . cetirizine (ZYRTEC) 10 MG tablet    Sig: Take 10 mg by mouth daily.  Marland Kitchen levothyroxine (SYNTHROID, LEVOTHROID) 75 MCG tablet    Sig: Take 1 tablet (75 mcg total) by mouth daily.    Dispense:  90 tablet    Refill:  1  . silodosin (RAPAFLO) 8 MG CAPS capsule    Sig: Take 1 capsule (8 mg total) by  mouth daily with breakfast.    Dispense:  90 capsule    Refill:  3  . aspirin EC 81 MG tablet    Sig: Take 1 tablet (81 mg total) by mouth daily.    Dispense:  90 tablet    Refill:  3  . rosuvastatin (CRESTOR) 5 MG tablet    Sig: Take 1 tablet (5 mg total) by mouth daily.    Dispense:  90 tablet    Refill:  3     Follow-up: Return in about 6 months (around 04/01/2017).  [name], MD

## 2017-01-10 ENCOUNTER — Other Ambulatory Visit: Payer: Self-pay | Admitting: Internal Medicine

## 2017-01-13 ENCOUNTER — Other Ambulatory Visit: Payer: Self-pay | Admitting: Internal Medicine

## 2017-02-22 DIAGNOSIS — H5213 Myopia, bilateral: Secondary | ICD-10-CM | POA: Insufficient documentation

## 2017-03-27 ENCOUNTER — Other Ambulatory Visit: Payer: Self-pay | Admitting: Internal Medicine

## 2017-03-27 DIAGNOSIS — E039 Hypothyroidism, unspecified: Secondary | ICD-10-CM

## 2017-03-30 ENCOUNTER — Encounter: Payer: Self-pay | Admitting: Internal Medicine

## 2017-03-30 NOTE — Telephone Encounter (Signed)
Pls advise if labs are ok before appt...Henry Owen

## 2017-04-01 ENCOUNTER — Telehealth: Payer: Self-pay | Admitting: Internal Medicine

## 2017-04-01 DIAGNOSIS — E039 Hypothyroidism, unspecified: Secondary | ICD-10-CM

## 2017-04-01 MED ORDER — LEVOTHYROXINE SODIUM 75 MCG PO TABS: 75.0000 ug | ORAL_TABLET | Freq: Every day | ORAL | 0 refills | Status: DC

## 2017-04-01 NOTE — Telephone Encounter (Signed)
Sent 5 pills to [facility] until appt on 4/23...Henry Owen

## 2017-04-01 NOTE — Telephone Encounter (Signed)
Patient has scheduled 6 month fu for Monday 4/23.  Patient is requesting 4 tabs of levothyroxine to be sent to [facility] in [place].  Patient is requesting this pharmacy to be noted in chart as his main pharmacy.

## 2017-04-05 ENCOUNTER — Encounter: Payer: Self-pay | Admitting: Internal Medicine

## 2017-04-05 ENCOUNTER — Ambulatory Visit (INDEPENDENT_AMBULATORY_CARE_PROVIDER_SITE_OTHER): Payer: Managed Care, Other (non HMO) | Admitting: Internal Medicine

## 2017-04-05 VITALS — BP 122/80 | HR 73 | Temp 98.1°F | Resp 16 | Ht 72.0 in | Wt 225.0 lb

## 2017-04-05 DIAGNOSIS — I10 Essential (primary) hypertension: Secondary | ICD-10-CM | POA: Diagnosis not present

## 2017-04-05 DIAGNOSIS — E039 Hypothyroidism, unspecified: Secondary | ICD-10-CM | POA: Diagnosis not present

## 2017-04-05 MED ORDER — LEVOTHYROXINE SODIUM 75 MCG PO TABS: 75.0000 ug | ORAL_TABLET | Freq: Every day | ORAL | 0 refills | Status: DC

## 2017-04-05 NOTE — Progress Notes (Signed)
Subjective:  Patient ID: Henry Owen, male    DOB: 03-15-1961  Age: 56 y.o. MRN: [id number]  CC: Hypertension and Hypothyroidism   HPI Henry Owen presents for f/up - he feels well and offers no complaints.  Outpatient Medications Prior to Visit  Medication Sig Dispense Refill  . aspirin EC 81 MG tablet Take 1 tablet (81 mg total) by mouth daily. 90 tablet 3  . cetirizine (ZYRTEC) 10 MG tablet Take 10 mg by mouth daily.    . rosuvastatin (CRESTOR) 5 MG tablet Take 1 tablet (5 mg total) by mouth daily. 90 tablet 3  . valsartan (DIOVAN) 320 MG tablet TAKE 1 TABLET (320 MG TOTAL) BY MOUTH DAILY. 90 tablet 1  . levothyroxine (SYNTHROID, LEVOTHROID) 75 MCG tablet Take 1 tablet (75 mcg total) by mouth daily. Keep 04/05/17 appt for future refills 5 tablet 0  . silodosin (RAPAFLO) 8 MG CAPS capsule Take 1 capsule (8 mg total) by mouth daily with breakfast. 90 capsule 3   No facility-administered medications prior to visit.     ROS Review of Systems  Constitutional: Negative for appetite change, diaphoresis, fatigue and unexpected weight change.  HENT: Negative.  Negative for trouble swallowing.   Eyes: Negative for visual disturbance.  Respiratory: Negative for cough, chest tightness, shortness of breath and wheezing.   Cardiovascular: Negative for chest pain, palpitations and leg swelling.  Gastrointestinal: Negative for abdominal pain, constipation, diarrhea and nausea.  Endocrine: Negative for cold intolerance and heat intolerance.  Genitourinary: Negative.  Negative for difficulty urinating.  Musculoskeletal: Negative.   Allergic/Immunologic: Negative.   Neurological: Negative.   Hematological: Negative for adenopathy. Does not bruise/bleed easily.  Psychiatric/Behavioral: Negative.     Objective:  BP 122/80 (BP Location: Left Arm, Patient Position: Sitting, Cuff Size: Normal)   Pulse 73   Temp 98.1 F (36.7 C) (Oral)   Ht 6' (1.829 m)   Wt 225 lb (102.1 kg)   SpO2 99%    BMI 30.52 kg/m   BP Readings from Last 3 Encounters:  04/05/17 122/80  10/01/16 (!) 142/88  06/05/15 124/82    Wt Readings from Last 3 Encounters:  04/05/17 225 lb (102.1 kg)  10/01/16 222 lb (100.7 kg)  06/05/15 213 lb (96.6 kg)    Physical Exam  Constitutional: He is oriented to person, place, and time. No distress.  HENT:  Mouth/Throat: Oropharynx is clear and moist. No oropharyngeal exudate.  Eyes: Conjunctivae are normal. Right eye exhibits no discharge. Left eye exhibits no discharge. No scleral icterus.  Neck: Normal range of motion. Neck supple. No JVD present. No tracheal deviation present. No thyromegaly present.  Cardiovascular: Normal rate, regular rhythm, normal heart sounds and intact distal pulses.  Exam reveals no gallop and no friction rub.   No murmur heard. Pulmonary/Chest: Effort normal and breath sounds normal. No stridor. No respiratory distress. He has no wheezes. He has no rales. He exhibits no tenderness.  Abdominal: Soft. Bowel sounds are normal. He exhibits no distension and no mass. There is no tenderness. There is no rebound and no guarding.  Musculoskeletal: Normal range of motion. He exhibits no edema, tenderness or deformity.  Lymphadenopathy:    He has no cervical adenopathy.  Neurological: He is oriented to person, place, and time.  Skin: Skin is warm and dry. No rash noted. He is not diaphoretic. No erythema. No pallor.  Vitals reviewed.   Lab Results  Component Value Date   WBC 8.0 09/22/2016   HGB 15.1 09/22/2016  HCT 44.2 09/22/2016   PLT 182 09/22/2016   GLUCOSE 103 (H) 04/06/2017   CHOL 163 09/22/2016   TRIG 179 (H) 09/22/2016   HDL 31 (L) 09/22/2016   LDLCALC 96 09/22/2016   ALT 17 09/22/2016   AST 20 09/22/2016   NA 139 04/06/2017   K 4.9 04/06/2017   CL 106 04/06/2017   CREATININE 0.97 04/06/2017   BUN 14 04/06/2017   CO2 27 04/06/2017   TSH 3.62 04/06/2017   PSA 0.3 09/22/2016    No results found.  Assessment &  Plan:   Henry Owen was seen today for hypertension and hypothyroidism.  Diagnoses and all orders for this visit:  Essential hypertension, benign- his BP is well controlled, lytes and renal fxn are normal -     Cancel: Basic metabolic panel; Future -     Basic Metabolic Panel (BMET); Future  Acquired hypothyroidism -     Cancel: TSH; Future -     TSH; Future  Hypothyroidism, unspecified type- his TSH is in the normal range, will cont the current dose -     Discontinue: levothyroxine (SYNTHROID, LEVOTHROID) 75 MCG tablet; Take 1 tablet (75 mcg total) by mouth daily. Keep 04/05/17 appt for future refills -     levothyroxine (SYNTHROID, LEVOTHROID) 75 MCG tablet; Take 1 tablet (75 mcg total) by mouth daily.  Other orders -     Cancel: Basic metabolic panel -     Cancel: TSH; Future   I have discontinued Henry Owen silodosin and levothyroxine. I have also changed his levothyroxine. Additionally, I am having him maintain his cetirizine, aspirin EC, rosuvastatin, and valsartan.  Meds ordered this encounter  Medications  . DISCONTD: levothyroxine (SYNTHROID, LEVOTHROID) 75 MCG tablet    Sig: Take 1 tablet (75 mcg total) by mouth daily. Keep 04/05/17 appt for future refills    Dispense:  14 tablet    Refill:  0  . levothyroxine (SYNTHROID, LEVOTHROID) 75 MCG tablet    Sig: Take 1 tablet (75 mcg total) by mouth daily.    Dispense:  90 tablet    Refill:  1     Follow-up: Return in about 6 months (around 10/05/2017).  [name], MD

## 2017-04-05 NOTE — Progress Notes (Signed)
Pre visit review using our clinic review tool, if applicable. No additional management support is needed unless otherwise documented below in the visit note. 

## 2017-04-05 NOTE — Patient Instructions (Signed)
Hypothyroidism Hypothyroidism is a disorder of the thyroid. The thyroid is a large gland that is located in the lower front of the neck. The thyroid releases hormones that control how the body works. With hypothyroidism, the thyroid does not make enough of these hormones. What are the causes? Causes of hypothyroidism may include:  Viral infections.  Pregnancy.  Your own defense system (immune system) attacking your thyroid.  Certain medicines.  Birth defects.  Past radiation treatments to your head or neck.  Past treatment with radioactive iodine.  Past surgical removal of part or all of your thyroid.  Problems with the gland that is located in the center of your brain (pituitary).  What are the signs or symptoms? Signs and symptoms of hypothyroidism may include:  Feeling as though you have no energy (lethargy).  Inability to tolerate cold.  Weight gain that is not explained by a change in diet or exercise habits.  Dry skin.  Coarse hair.  Menstrual irregularity.  Slowing of thought processes.  Constipation.  Sadness or depression.  How is this diagnosed? Your health care provider may diagnose hypothyroidism with blood tests and ultrasound tests. How is this treated? Hypothyroidism is treated with medicine that replaces the hormones that your body does not make. After you begin treatment, it may take several weeks for symptoms to go away. Follow these instructions at home:  Take medicines only as directed by your health care provider.  If you start taking any new medicines, tell your health care provider.  Keep all follow-up visits as directed by your health care provider. This is important. As your condition improves, your dosage needs may change. You will need to have blood tests regularly so that your health care provider can watch your condition. Contact a health care provider if:  Your symptoms do not get better with treatment.  You are taking thyroid  replacement medicine and: ? You sweat excessively. ? You have tremors. ? You feel anxious. ? You lose weight rapidly. ? You cannot tolerate heat. ? You have emotional swings. ? You have diarrhea. ? You feel weak. Get help right away if:  You develop chest pain.  You develop an irregular heartbeat.  You develop a rapid heartbeat. This information is not intended to replace advice given to you by your health care provider. Make sure you discuss any questions you have with your health care provider. Document Released: 11/30/2005 Document Revised: 05/07/2016 Document Reviewed: 04/17/2014 Elsevier Interactive Patient Education  2017 [employer]  

## 2017-04-06 ENCOUNTER — Other Ambulatory Visit: Payer: Self-pay | Admitting: Internal Medicine

## 2017-04-06 ENCOUNTER — Encounter: Payer: Self-pay | Admitting: Internal Medicine

## 2017-04-06 LAB — BASIC METABOLIC PANEL
BUN: 14 mg/dL (ref 7–25)
CALCIUM: 9 mg/dL (ref 8.6–10.3)
CO2: 27 mmol/L (ref 20–31)
CREATININE: 0.97 mg/dL (ref 0.70–1.33)
Chloride: 106 mmol/L (ref 98–110)
GLUCOSE: 103 mg/dL — AB (ref 65–99)
Potassium: 4.9 mmol/L (ref 3.5–5.3)
SODIUM: 139 mmol/L (ref 135–146)

## 2017-04-06 LAB — TSH: TSH: 3.62 m[IU]/L (ref 0.40–4.50)

## 2017-04-06 MED ORDER — LEVOTHYROXINE SODIUM 75 MCG PO TABS: 75.0000 ug | ORAL_TABLET | Freq: Every day | ORAL | 1 refills | Status: DC

## 2017-04-07 ENCOUNTER — Encounter: Payer: Self-pay | Admitting: Internal Medicine

## 2017-07-05 ENCOUNTER — Other Ambulatory Visit: Payer: Self-pay | Admitting: Internal Medicine

## 2017-07-26 ENCOUNTER — Encounter: Payer: Self-pay | Admitting: Internal Medicine

## 2017-07-27 ENCOUNTER — Other Ambulatory Visit: Payer: Self-pay | Admitting: Internal Medicine

## 2017-07-27 DIAGNOSIS — I10 Essential (primary) hypertension: Secondary | ICD-10-CM

## 2017-07-27 MED ORDER — IRBESARTAN 300 MG PO TABS: 300.0000 mg | ORAL_TABLET | Freq: Every day | ORAL | 1 refills | Status: DC

## 2017-10-04 ENCOUNTER — Other Ambulatory Visit: Payer: Self-pay | Admitting: Internal Medicine

## 2017-10-04 DIAGNOSIS — E785 Hyperlipidemia, unspecified: Secondary | ICD-10-CM

## 2017-10-04 DIAGNOSIS — E039 Hypothyroidism, unspecified: Secondary | ICD-10-CM

## 2017-10-11 ENCOUNTER — Encounter: Payer: Self-pay | Admitting: Internal Medicine

## 2017-10-11 DIAGNOSIS — E039 Hypothyroidism, unspecified: Secondary | ICD-10-CM

## 2017-10-11 MED ORDER — LEVOTHYROXINE SODIUM 75 MCG PO TABS: 75.0000 ug | ORAL_TABLET | Freq: Every day | ORAL | 1 refills | Status: DC

## 2018-01-09 ENCOUNTER — Other Ambulatory Visit: Payer: Self-pay | Admitting: Internal Medicine

## 2018-01-09 DIAGNOSIS — I10 Essential (primary) hypertension: Secondary | ICD-10-CM

## 2018-01-11 ENCOUNTER — Encounter: Payer: Self-pay | Admitting: Internal Medicine

## 2018-01-11 ENCOUNTER — Ambulatory Visit (INDEPENDENT_AMBULATORY_CARE_PROVIDER_SITE_OTHER): Payer: 59 | Admitting: Internal Medicine

## 2018-01-11 VITALS — BP 140/90 | HR 84 | Temp 98.2°F | Ht 73.0 in | Wt 223.1 lb

## 2018-01-11 DIAGNOSIS — E039 Hypothyroidism, unspecified: Secondary | ICD-10-CM

## 2018-01-11 NOTE — Progress Notes (Signed)
   Subjective:  Patient ID: Henry Owen, male    DOB: Jun 23, 1961  Age: 57 y.o. MRN: [id number]  CC: No chief complaint on file.   HPI Claron Rosencrans presents for f/u- he left without being seen  Outpatient Medications Prior to Visit  Medication Sig Dispense Refill  . aspirin EC 81 MG tablet Take 1 tablet (81 mg total) by mouth daily. 90 tablet 3  . cetirizine (ZYRTEC) 10 MG tablet Take 10 mg by mouth daily.    . irbesartan (AVAPRO) 300 MG tablet Take 1 tablet (300 mg total) by mouth daily. 90 tablet 1  . levothyroxine (SYNTHROID, LEVOTHROID) 75 MCG tablet Take 1 tablet (75 mcg total) by mouth daily. 90 tablet 1  . rosuvastatin (CRESTOR) 5 MG tablet Take 1 tablet (5 mg total) by mouth daily. 90 tablet 3   No facility-administered medications prior to visit.     ROS Review of Systems  Objective:  BP 140/90 (BP Location: Left Arm, Patient Position: Sitting, Cuff Size: Normal)   Pulse 84   Temp 98.2 F (36.8 C)   Ht 6\' 1"  (1.854 m)   Wt 223 lb 1.3 oz (101.2 kg)   SpO2 97%   BMI 29.43 kg/m   BP Readings from Last 3 Encounters:  01/11/18 140/90  04/05/17 122/80  10/01/16 (!) 142/88    Wt Readings from Last 3 Encounters:  01/11/18 223 lb 1.3 oz (101.2 kg)  04/05/17 225 lb (102.1 kg)  10/01/16 222 lb (100.7 kg)    Physical Exam  Lab Results  Component Value Date   WBC 8.0 09/22/2016   HGB 15.1 09/22/2016   HCT 44.2 09/22/2016   PLT 182 09/22/2016   GLUCOSE 103 (H) 04/06/2017   CHOL 163 09/22/2016   TRIG 179 (H) 09/22/2016   HDL 31 (L) 09/22/2016   LDLCALC 96 09/22/2016   ALT 17 09/22/2016   AST 20 09/22/2016   NA 139 04/06/2017   K 4.9 04/06/2017   CL 106 04/06/2017   CREATININE 0.97 04/06/2017   BUN 14 04/06/2017   CO2 27 04/06/2017   TSH 3.62 04/06/2017   PSA 0.3 09/22/2016    No results found.  Assessment & Plan:   There are no diagnoses linked to this encounter. I have discontinued Josua Longton's rosuvastatin. I am also having him maintain  his cetirizine, aspirin EC, irbesartan, and levothyroxine.  No orders of the defined types were placed in this encounter.    Follow-up: No Follow-up on file.  [name], MD

## 2018-01-12 ENCOUNTER — Other Ambulatory Visit: Payer: Self-pay | Admitting: Internal Medicine

## 2018-01-13 ENCOUNTER — Encounter: Payer: Self-pay | Admitting: Internal Medicine

## 2018-01-13 NOTE — Telephone Encounter (Signed)
Routed message to Dr. Ronnald Ramp to address.

## 2018-01-14 ENCOUNTER — Other Ambulatory Visit: Payer: Self-pay | Admitting: Internal Medicine

## 2018-01-14 DIAGNOSIS — I10 Essential (primary) hypertension: Secondary | ICD-10-CM

## 2018-02-01 ENCOUNTER — Ambulatory Visit: Payer: Self-pay | Admitting: *Deleted

## 2018-02-01 NOTE — Telephone Encounter (Signed)
Patient is calling to report that he has had some elevations in his BP that are not going down. They have been accompanied by symptoms that seen worrisome to him. He has actually doubled his night time dosing of his BP medication for a couple nights this week and it seemed to help his symptoms and bring his BP down. He did double it last night and his BP did go back up with return of symptoms. Appointment made for in morning due to high nature of BP. Patient advised to seek help if he should have changes in his symptoms or the severity of them. He voices understanding. Reason for Disposition . [1] Systolic BP  >= 610 OR Diastolic >= 90 AND [9] taking BP medications  Answer Assessment - Initial Assessment Questions 1. BLOOD PRESSURE: "What is the blood pressure?" "Did you take at least two measurements 5 minutes apart?"     151/93     158/95 several days ago 2. ONSET: "When did you take your blood pressure?"     Earlier today 3. HOW: "How did you obtain the blood pressure?" (e.g., visiting nurse, automatic home BP monitor)     Wrist machine 4. HISTORY: "Do you have a history of high blood pressure?"     yes 5. MEDICATIONS: "Are you taking any medications for blood pressure?" "Have you missed any doses recently?"     Yes- no missed doses- patient did double dose 2 nights in a row and it seemed to help 6. OTHER SYMPTOMS: "Do you have any symptoms?" (e.g., headache, chest pain, blurred vision, difficulty breathing, weakness)     Slight headache and chest discomfort at times 7. PREGNANCY: "Is there any chance you are pregnant?" "When was your last menstrual period?"     n/a  Protocols used: HIGH BLOOD PRESSURE-A-AH

## 2018-02-02 ENCOUNTER — Encounter: Payer: Self-pay | Admitting: Family

## 2018-02-02 ENCOUNTER — Ambulatory Visit (INDEPENDENT_AMBULATORY_CARE_PROVIDER_SITE_OTHER): Payer: 59 | Admitting: Family

## 2018-02-02 VITALS — BP 154/90 | HR 74 | Temp 98.7°F | Ht 73.0 in | Wt 222.1 lb

## 2018-02-02 DIAGNOSIS — E039 Hypothyroidism, unspecified: Secondary | ICD-10-CM | POA: Diagnosis not present

## 2018-02-02 DIAGNOSIS — I10 Essential (primary) hypertension: Secondary | ICD-10-CM

## 2018-02-02 DIAGNOSIS — E782 Mixed hyperlipidemia: Secondary | ICD-10-CM | POA: Diagnosis not present

## 2018-02-02 MED ORDER — HYDROCHLOROTHIAZIDE 25 MG PO TABS: 25.0000 mg | ORAL_TABLET | Freq: Every day | ORAL | 0 refills | Status: DC

## 2018-02-02 NOTE — Progress Notes (Signed)
Henry Owen is a 57 y.o. male with the following history as recorded in [employer]:  Patient Active Problem List   Diagnosis Date Noted  . 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 Hypothyroidism 05/02/2014  . Tobacco abuse 11/30/2011  . Routine general medical examination at a health care facility 11/27/2011  . Essential hypertension, benign 11/27/2011  . Nonspecific abnormal electrocardiogram (ECG) (EKG) 11/27/2011    Current Outpatient Medications  Medication Sig Dispense Refill  . aspirin EC 81 MG tablet Take 1 tablet (81 mg total) by mouth daily. 90 tablet 3  . cetirizine (ZYRTEC) 10 MG tablet Take 10 mg by mouth daily.    . irbesartan (AVAPRO) 300 MG tablet TAKE 1 TABLET BY MOUTH EVERY DAY 90 tablet 0  . levothyroxine (SYNTHROID, LEVOTHROID) 75 MCG tablet Take 1 tablet (75 mcg total) by mouth daily. 90 tablet 1  . rosuvastatin (CRESTOR) 5 MG tablet Take 1 tablet (5 mg total) by mouth daily at 6 PM. 90 tablet 1  . hydrochlorothiazide (HYDRODIURIL) 25 MG tablet Take 1 tablet (25 mg total) by mouth daily. 90 tablet 0   No current facility-administered medications for this visit.     Allergies: Patient has no known allergies.  Past Medical History:  Diagnosis Date  . Hypertension   . Warts, genital     Past Surgical History:  Procedure Laterality Date  . KNEE ARTHROSCOPY  2000  . LASIK  2005    Family History  Problem Relation Age of Onset  . Early death Other   . Heart disease Father        Father died of MI at age 99  . Hypertension Father   . Cancer Neg Hx   . Diabetes Neg Hx   . Colon cancer Neg Hx   . Stomach cancer Neg Hx     Social History   Tobacco Use  . Smoking status: Current Every Day Smoker    Packs/day: 1.50    Years: 25.00    Pack years: 37.50    Types: Cigarettes  . Smokeless tobacco: Never Used  Substance Use Topics  . Alcohol use: No    Subjective:  Patient presents with concerns for elevated blood pressure; has  noticed his pressure has been up for the past 3 weeks; is concerned that Crestor could be causing the elevation feels that changes in his blood pressure started at the same time he started the Crestor; of note, he was recently changed from [place] to Avapro due to manufacturing problems; has been doubling up on the Avapro some in the past week to "get better numbers." Admits has not taken extra dose of Avapro in the past 2-3 days however.  Denies any muscle aches; does feel occasional headache; Denies any chest pain, shortness of breath, blurred vision or headache.    Objective:  Vitals:   02/02/18 0824  BP: (!) 154/90  Pulse: 74  Temp: 98.7 F (37.1 C)  TempSrc: Oral  SpO2: 99%  Weight: 222 lb 1.3 oz (100.7 kg)  Height: 6\' 1"  (1.854 m)    General: Well developed, well nourished, in no acute distress  Skin : Warm and dry.  Head: Normocephalic and atraumatic  Eyes: Sclera and conjunctiva clear; pupils round and reactive to light; extraocular movements intact  Ears: External normal; canals clear; tympanic membranes normal  Oropharynx: Pink, supple. No suspicious lesions  Neck: Supple without thyromegaly, adenopathy  Lungs: Respirations unlabored; clear to auscultation bilaterally without wheeze, rales, rhonchi  CVS  exam: normal rate and regular rhythm.  Neurologic: Alert and oriented; speech intact; face symmetrical; moves all extremities well; CNII-XII intact without focal deficit  Assessment:  1. Essential hypertension, benign   2. Mixed hyperlipidemia   3. Acquired hypothyroidism     Plan:  1. Uncontrolled; low suspicion that Crestor is causing the blood pressure to be uncontrolled; feel more likely that not responding as well to Avapro as he did to Diovan; check EKG in office today- no acute changes; no ischemia notes; EKG is same as when checked in 2017; will try adding HCTZ 25 mg daily; will also update CBC and CMP today; patient to check his blood pressure daily and call back in  a week with response/ numbers to new regimen; follow-up to be determined.  2. Stay on Crestor 5 mg; appears to be tolerating well; keep planned follow up with Dr. Ronnald Ramp on this medication; 3. Discussed need to take his Levothyroxine separately from other medications if possible- try to separate by 30 minutes; he expresses understanding.   No Follow-up on file.  Orders Placed This Encounter  Procedures  . CBC w/Diff    Standing Status:   Future    Standing Expiration Date:   02/02/2019  . COMPLETE METABOLIC PANEL WITH GFR    Standing Status:   Future    Standing Expiration Date:   02/02/2019  . EKG 12-Lead    Requested Prescriptions   Signed Prescriptions Disp Refills  . hydrochlorothiazide (HYDRODIURIL) 25 MG tablet 90 tablet 0    Sig: Take 1 tablet (25 mg total) by mouth daily.

## 2018-02-04 ENCOUNTER — Other Ambulatory Visit: Payer: Self-pay | Admitting: Family

## 2018-02-05 LAB — COMPLETE METABOLIC PANEL WITH GFR
AG Ratio: 1.3 (calc) (ref 1.0–2.5)
ALBUMIN MSPROF: 4.2 g/dL (ref 3.6–5.1)
ALT: 26 U/L (ref 9–46)
AST: 23 U/L (ref 10–35)
Alkaline phosphatase (APISO): 64 U/L (ref 40–115)
BUN: 12 mg/dL (ref 7–25)
CALCIUM: 9.6 mg/dL (ref 8.6–10.3)
CO2: 28 mmol/L (ref 20–32)
CREATININE: 1.03 mg/dL (ref 0.70–1.33)
Chloride: 104 mmol/L (ref 98–110)
GFR, EST AFRICAN AMERICAN: 94 mL/min/{1.73_m2} (ref >=60)
GFR, EST NON AFRICAN AMERICAN: 81 mL/min/{1.73_m2} (ref >=60)
GLUCOSE: 94 mg/dL (ref 65–99)
Globulin: 3.3 g/dL (calc) (ref 1.9–3.7)
Potassium: 4.5 mmol/L (ref 3.5–5.3)
Sodium: 140 mmol/L (ref 135–146)
TOTAL PROTEIN: 7.5 g/dL (ref 6.1–8.1)
Total Bilirubin: 0.7 mg/dL (ref 0.2–1.2)

## 2018-02-05 LAB — CBC WITH DIFFERENTIAL/PLATELET
BASOS ABS: 37 {cells}/uL (ref 0–200)
Basophils Relative: 0.4 %
EOS PCT: 1.3 %
Eosinophils Absolute: 121 cells/uL (ref 15–500)
HCT: 47 % (ref 38.5–50.0)
HEMOGLOBIN: 16.2 g/dL (ref 13.2–17.1)
Lymphs Abs: 1618 cells/uL (ref 850–3900)
MCH: 31 pg (ref 27.0–33.0)
MCHC: 34.5 g/dL (ref 32.0–36.0)
MCV: 90 fL (ref 80.0–100.0)
MONOS PCT: 8 %
MPV: 10.4 fL (ref 7.5–12.5)
NEUTROS ABS: 6780 {cells}/uL (ref 1500–7800)
Neutrophils Relative %: 72.9 %
PLATELETS: 194 10*3/uL (ref 140–400)
RBC: 5.22 10*6/uL (ref 4.20–5.80)
RDW: 12.2 % (ref 11.0–15.0)
TOTAL LYMPHOCYTE: 17.4 %
WBC mixed population: 744 cells/uL (ref 200–950)
WBC: 9.3 10*3/uL (ref 3.8–10.8)

## 2018-04-11 ENCOUNTER — Other Ambulatory Visit: Payer: Self-pay | Admitting: Internal Medicine

## 2018-04-11 DIAGNOSIS — E039 Hypothyroidism, unspecified: Secondary | ICD-10-CM

## 2018-04-11 DIAGNOSIS — I10 Essential (primary) hypertension: Secondary | ICD-10-CM

## 2018-04-12 ENCOUNTER — Other Ambulatory Visit: Payer: Self-pay | Admitting: Internal Medicine

## 2018-04-12 DIAGNOSIS — I10 Essential (primary) hypertension: Secondary | ICD-10-CM

## 2018-04-12 MED ORDER — IRBESARTAN 300 MG PO TABS: 300.0000 mg | ORAL_TABLET | Freq: Every day | ORAL | 0 refills | Status: DC

## 2018-04-19 ENCOUNTER — Ambulatory Visit (INDEPENDENT_AMBULATORY_CARE_PROVIDER_SITE_OTHER): Payer: 59 | Admitting: Family Medicine

## 2018-04-19 ENCOUNTER — Other Ambulatory Visit: Payer: Self-pay

## 2018-04-19 ENCOUNTER — Encounter: Payer: Self-pay | Admitting: Family Medicine

## 2018-04-19 VITALS — BP 124/88 | HR 76 | Temp 99.0°F | Resp 12 | Ht 73.0 in | Wt 220.1 lb

## 2018-04-19 DIAGNOSIS — R739 Hyperglycemia, unspecified: Secondary | ICD-10-CM

## 2018-04-19 DIAGNOSIS — E786 Lipoprotein deficiency: Secondary | ICD-10-CM | POA: Diagnosis not present

## 2018-04-19 DIAGNOSIS — E039 Hypothyroidism, unspecified: Secondary | ICD-10-CM

## 2018-04-19 DIAGNOSIS — Z720 Tobacco use: Secondary | ICD-10-CM | POA: Diagnosis not present

## 2018-04-19 DIAGNOSIS — I10 Essential (primary) hypertension: Secondary | ICD-10-CM

## 2018-04-19 NOTE — Progress Notes (Signed)
Patient ID: Henry Owen, male    DOB: 1961/05/22, 58 y.o.   MRN: [id number]  Chief Complaint  Patient presents with  . Establish Care    Allergies Patient has no known allergies.  Subjective:   Henry Owen is a 57 y.o. male who presents to [facility] today.  HPI Henry Owen presents as a new patient visit today to establish care.  He has previously been seen by another physician and wishes to transfer his care to our office.  He works for [employer] and moved from [place] area approximately 4 years ago to [place], [place].  He lives alone and is single.  He has no children.  He enjoys working in his yard, woodworking, and staying active.  He does smoke 1 to 1-1/2 pack/day for over 25 years.  He has been cutting down on his cigarette use and is down to 1 pack/day.  He denies any shortness of breath or breathing difficulties.  He has a history of high blood pressure and has been on Avapro for quite some time.  He has no side effects with the medication.  He is also been hypothyroid for over 4 years.  He has no history of thyroid nodules or thyroid masses.  He has never had an ultrasound.  His speech is normal.  No difficulty swallowing.  He has been on his current dose for several years.  He reports his energy is good.  Reports he has been told that he needs to be on cholesterol in the past.  Was placed on Crestor and reports that he had terrible side effects.  He reports that it caused muscle aches and pains and other symptoms.  He reports that since that time he has been working on his diet and increasing his exercise.  He reports that when he lived in [place] he is to eat out fast food more frequently.  However, he reports that he cooks and tries to eat a healthier diet.  He is not interested in starting a medication today but would like to see how his cholesterol is running and if it has improved. He is trying to cut down on his smoking but he is not  interested in a medication for smoking cessation.  He does take an aspirin every day for cardiovascular protection.  He has no history of a GI bleed or dyspepsia.  He is not on chronic steroids.  He does not use NSAIDs on a regular basis.  He denies any heartburn or GI problems.  His colonoscopy is up-to-date.  He denies any chest pain, shortness of breath, swelling in his extremities.  He denies any cough.   Past Medical History:  Diagnosis Date  . BPH with obstruction/lower urinary tract symptoms   . Enlarged prostate   . Hypertension   . Hypothyroidism   . Tobacco use   . Warts, genital     Past Surgical History:  Procedure Laterality Date  . KNEE ARTHROSCOPY  2000  . LASIK  2005  . WISDOM TOOTH EXTRACTION      Family History  Problem Relation Age of Onset  . Early death Other   . Heart disease Father        Father died of MI at age 44  . Hypertension Father   . Diabetes Mother   . Cancer Neg Hx   . Colon cancer Neg Hx   . Stomach cancer Neg Hx      Social History  Socioeconomic History  . Marital status: Single    Spouse name: Not on file  . Number of children: Not on file  . Years of education: Not on file  . Highest education level: Not on file  Occupational History  . Not on file  Social Needs  . Financial resource strain: Not hard at all  . Food insecurity:    Worry: Never true    Inability: Never true  . Transportation needs:    Medical: No    Non-medical: No  Tobacco Use  . Smoking status: Current Every Day Smoker    Packs/day: 1.50    Years: 25.00    Pack years: 37.50    Types: Cigarettes  . Smokeless tobacco: Never Used  Substance and Sexual Activity  . Alcohol use: No  . Drug use: No  . Sexual activity: Not Currently    Partners: Female  Lifestyle  . Physical activity:    Days per week: 0 days    Minutes per session: 0 min  . Stress: To some extent  Relationships  . Social connections:    Talks on phone: More than three times a week      Gets together: Twice a week    Attends religious service: Never    Active member of club or organization: No    Attends meetings of clubs or organizations: Never    Relationship status: Never married  Other Topics Concern  . Not on file  Social History Narrative   Single. No children. Enjoys golfing and power tools and wood working.    Spends time in yard.       Work at [employer].    Eats all food group.   Wear seatbelt.    Cooks at home a lot.      Caffienated drinks-yes   Seat belt use often-yes   Regular Exercise-no   Smoke alarm in the home-yes   Firearms/guns in the home-yes   History of physical abuse-no            Current Outpatient Medications on File Prior to Visit  Medication Sig Dispense Refill  . aspirin EC 81 MG tablet Take 1 tablet (81 mg total) by mouth daily. 90 tablet 3  . cetirizine (ZYRTEC) 10 MG tablet Take 10 mg by mouth daily as needed for allergies or rhinitis.     Marland Kitchen irbesartan (AVAPRO) 300 MG tablet TAKE 1 TABLET BY MOUTH EVERY DAY 90 tablet 0  . levothyroxine (SYNTHROID, LEVOTHROID) 75 MCG tablet TAKE 1 TABLET BY MOUTH EVERY DAY 90 tablet 1  . rosuvastatin (CRESTOR) 5 MG tablet Take 1 tablet (5 mg total) by mouth daily at 6 PM. (Patient not taking: Reported on 04/19/2018) 90 tablet 1   No current facility-administered medications on file prior to visit.     Review of Systems  Constitutional: Negative for appetite change, chills, fatigue, fever and unexpected weight change.  HENT: Negative for dental problem, sore throat, trouble swallowing and voice change.   Eyes: Negative for visual disturbance.  Respiratory: Negative for cough, chest tightness, shortness of breath and wheezing.   Cardiovascular: Negative for chest pain, palpitations and leg swelling.  Gastrointestinal: Negative for abdominal pain, diarrhea, nausea and vomiting.  Genitourinary: Negative for decreased urine volume, dysuria and frequency.  Musculoskeletal: Negative for myalgias.   Skin: Negative for rash.       Has occasional urticaria and uses Zyrtec as needed.  Neurological: Negative for dizziness, tremors, syncope, facial asymmetry, weakness and headaches.  Hematological: Negative for adenopathy. Does not bruise/bleed easily.  Psychiatric/Behavioral: Negative for decreased concentration and dysphoric mood. The patient is not nervous/anxious.      Objective:   BP 124/88 (BP Location: Left Arm, Patient Position: Sitting, Cuff Size: Large)   Pulse 76   Temp 99 F (37.2 C) (Oral)   Resp 12   Ht 6\' 1"  (1.854 m)   Wt 220 lb 1.9 oz (99.8 kg)   SpO2 95%   BMI 29.04 kg/m   Physical Exam  Constitutional: He is oriented to person, place, and time. He appears well-developed and well-nourished.  HENT:  Head: Normocephalic and atraumatic.  Eyes: Pupils are equal, round, and reactive to light. EOM are normal.  Neck: Normal range of motion. Neck supple. No JVD present. No tracheal deviation present. No thyromegaly present.  Cardiovascular: Normal rate, regular rhythm and normal heart sounds.  Pulmonary/Chest: Effort normal and breath sounds normal.  Abdominal: Soft. Bowel sounds are normal.  Musculoskeletal: He exhibits no edema.  Lymphadenopathy:    He has no cervical adenopathy.  Neurological: He is alert and oriented to person, place, and time. No cranial nerve deficit.  Skin: Skin is warm, dry and intact. Capillary refill takes less than 2 seconds.  Psychiatric: He has a normal mood and affect. His behavior is normal. Judgment and thought content normal.  Vitals reviewed.   Depression screen [facility] 2/9 04/19/2018 02/02/2018  Decreased Interest 0 0  Down, Depressed, Hopeless 0 0  PHQ - 2 Score 0 0    Assessment and Plan  1. Hypothyroidism, unspecified type TSH reviewed from 2016, which was greater than 4.  We will check levels at this time and increase medication as needed.  Patient was counseled regarding the proper way to take thyroid medication on an empty  stomach. - TSH  2. Essential hypertension, benign Blood pressure well controlled. Lifestyle modifications discussed with patient including a diet emphasizing vegetables, fruits, and whole grains. Limiting intake of sodium to less than 2,400 mg per day.  Recommendations discussed include consuming low-fat dairy products, poultry, fish, legumes, non-tropical vegetable oils, and nuts; and limiting intake of sweets, sugar-sweetened beverages, and red meat. Discussed following a plan such as the Dietary Approaches to Stop Hypertension (DASH) diet. Patient to read up on this diet.  We will continue current medications at this time.  He was counseled regarding the risk versus benefits of medication.  He was counseled concerning possible side effects of medication.  He was told of those occur to call or return to clinic and discontinue medication. Continue aspirin 81 mg p.o. daily.  Continue medication.  Discussed with patient that he needs his potassium and creatinine checked every 6 months on this medication. - COMPLETE METABOLIC PANEL WITH GFR  3. Hyperglycemia Review of labs in chart reveal elevated blood sugar in the past.  Screen for diabetes.  Dietary modifications regarding prevention of diabetes discussed today. - Hemoglobin A1c  4. Low HDL (under 40) Cholesterol panel from greater than 1 year ago reviewed today.  Based upon those values, his calculated 10-year ASCVD risk was approximately 15%.  We discussed his cholesterol values from his previous FLP today.  His HDL is low, he has hypertension, and he does smoke.  We discussed that his LDL was not terrible but because of his other cardiovascular risk factors he would be indicated to be on a statin.  We discussed smoking cessation and and he is going to try to cut down but he is not interested  in quitting at this time.  His blood pressure is well controlled.  He should continue diet and exercise modifications.  We will check his FLP today.  He will  consider starting statin medication.  I told him I would let him know and if he is agreeable we will plan to start pravastatin due to its lower side effect profile. - Lipid panel 5.  Tobacco abuse The 5 A's Model for treating Tobacco Use and Dependence was used today. I have identified and documented tobacco use status for this patient. I have urged the patient to quit tobacco use. At this time, the patient is unwilling and not ready to attempt to quit. I have provided patient with information regarding risks, cessation techniques, and interventions that might increase future attempts to quit smoking. I will plan on again addressing tobacco dependence at the next visit.  Office visit today was greater than 45 minutes.  Greater than 50% of office visit spent counseling and coordinating care.  Patient will plan to follow-up for CPE in approximately 3 to 4 months.  Records requested.  Return in about 3 months (around 07/20/2018) for CPE. [name], MD 04/19/2018

## 2018-04-19 NOTE — Patient Instructions (Signed)
Shingrix vaccine-check insurance coverage.    Coping with Quitting Smoking Quitting smoking is a physical and mental challenge. You will face cravings, withdrawal symptoms, and temptation. Before quitting, work with your health care provider to make a plan that can help you cope. Preparation can help you quit and keep you from giving in. How can I cope with cravings? Cravings usually last for 5-10 minutes. If you get through it, the craving will pass. Consider taking the following actions to help you cope with cravings:  Keep your mouth busy: ? Chew sugar-free gum. ? Suck on hard candies or a straw. ? Brush your teeth.  Keep your hands and body busy: ? Immediately change to a different activity when you feel a craving. ? Squeeze or play with a ball. ? Do an activity or a hobby, like making bead jewelry, practicing needlepoint, or working with wood. ? Mix up your normal routine. ? Take a short exercise break. Go for a quick walk or run up and down stairs. ? Spend time in public places where smoking is not allowed.  Focus on doing something kind or helpful for someone else.  Call a friend or family member to talk during a craving.  Join a support group.  Call a quit line, such as 1-800-QUIT-NOW.  Talk with your health care provider about medicines that might help you cope with cravings and make quitting easier for you.  How can I deal with withdrawal symptoms? Your body may experience negative effects as it tries to get used to not having nicotine in the system. These effects are called withdrawal symptoms. They may include:  Feeling hungrier than normal.  Trouble concentrating.  Irritability.  Trouble sleeping.  Feeling depressed.  Restlessness and agitation.  Craving a cigarette.  To manage withdrawal symptoms:  Avoid places, people, and activities that trigger your cravings.  Remember why you want to quit.  Get plenty of sleep.  Avoid coffee and other  caffeinated drinks. These may worsen some of your symptoms.  How can I handle social situations? Social situations can be difficult when you are quitting smoking, especially in the first few weeks. To manage this, you can:  Avoid parties, bars, and other social situations where people might be smoking.  Avoid alcohol.  Leave right away if you have the urge to smoke.  Explain to your family and friends that you are quitting smoking. Ask for understanding and support.  Plan activities with friends or family where smoking is not an option.  What are some ways I can cope with stress? Wanting to smoke may cause stress, and stress can make you want to smoke. Find ways to manage your stress. Relaxation techniques can help. For example:  Breathe slowly and deeply, in through your nose and out through your mouth.  Listen to soothing, relaxing music.  Talk with a family member or friend about your stress.  Light a candle.  Soak in a bath or take a shower.  Think about a peaceful place.  What are some ways I can prevent weight gain? Be aware that many people gain weight after they quit smoking. However, not everyone does. To keep from gaining weight, have a plan in place before you quit and stick to the plan after you quit. Your plan should include:  Having healthy snacks. When you have a craving, it may help to: ? Eat plain popcorn, crunchy carrots, celery, or other cut vegetables. ? Chew sugar-free gum.  Changing how you eat: ?  Eat small portion sizes at meals. ? Eat 4-6 small meals throughout the day instead of 1-2 large meals a day. ? Be mindful when you eat. Do not watch television or do other things that might distract you as you eat.  Exercising regularly: ? Make time to exercise each day. If you do not have time for a long workout, do short bouts of exercise for 5-10 minutes several times a day. ? Do some form of strengthening exercise, like weight lifting, and some form of  aerobic exercise, like running or swimming.  Drinking plenty of water or other low-calorie or no-calorie drinks. Drink 6-8 glasses of water daily, or as much as instructed by your health care provider.  Summary  Quitting smoking is a physical and mental challenge. You will face cravings, withdrawal symptoms, and temptation to smoke again. Preparation can help you as you go through these challenges.  You can cope with cravings by keeping your mouth busy (such as by chewing gum), keeping your body and hands busy, and making calls to family, friends, or a helpline for people who want to quit smoking.  You can cope with withdrawal symptoms by avoiding places where people smoke, avoiding drinks with caffeine, and getting plenty of rest.  Ask your health care provider about the different ways to prevent weight gain, avoid stress, and handle social situations. This information is not intended to replace advice given to you by your health care provider. Make sure you discuss any questions you have with your health care provider. Document Released: 11/27/2016 Document Revised: 11/27/2016 Document Reviewed: 11/27/2016 Elsevier Interactive Patient Education  2018 [employer].  Cholesterol Cholesterol is a fat. Your body needs a small amount of cholesterol. Cholesterol (plaque) may build up in your blood vessels (arteries). That makes you more likely to have a heart attack or stroke. You cannot feel your cholesterol level. Having a blood test is the only way to find out if your level is high. Keep your test results. Work with your doctor to keep your cholesterol at a good level. What do the results mean?  Total cholesterol is how much cholesterol is in your blood.  LDL is bad cholesterol. This is the type that can build up. Try to have low LDL.  HDL is good cholesterol. It cleans your blood vessels and carries LDL away. Try to have high HDL.  Triglycerides are fat that the body can store or burn  for energy. What are good levels of cholesterol?  Total cholesterol below 200.  LDL below 100 is good for people who have health risks. LDL below 70 is good for people who have very high risks.  HDL above 40 is good. It is best to have HDL of 60 or higher.  Triglycerides below 150. How can I lower my cholesterol? Diet Follow your diet program as told by your doctor.  Choose fish, white meat chicken, or [place] that is roasted or baked. Try not to eat red meat, fried foods, sausage, or lunch meats.  Eat lots of fresh fruits and vegetables.  Choose whole grains, beans, pasta, potatoes, and cereals.  Choose olive oil, corn oil, or canola oil. Only use small amounts.  Try not to eat butter, mayonnaise, shortening, or palm kernel oils.  Try not to eat foods with trans fats.  Choose low-fat or nonfat dairy foods. ? Drink skim or nonfat milk. ? Eat low-fat or nonfat yogurt and cheeses. ? Try not to drink whole milk or cream. ? Try  not to eat ice cream, egg yolks, or full-fat cheeses.  Healthy desserts include angel food cake, ginger snaps, animal crackers, hard candy, popsicles, and low-fat or nonfat frozen yogurt. Try not to eat pastries, cakes, pies, and cookies.  Exercise Follow your exercise program as told by your doctor.  Be more active. Try gardening, walking, and taking the stairs.  Ask your doctor about ways that you can be more active.  Medicine  Take over-the-counter and prescription medicines only as told by your doctor. This information is not intended to replace advice given to you by your health care provider. Make sure you discuss any questions you have with your health care provider. Document Released: 02/26/2009 Document Revised: 07/01/2016 Document Reviewed: 06/11/2016 Elsevier Interactive Patient Education  [employer].

## 2018-04-22 LAB — COMPLETE METABOLIC PANEL WITH GFR
AG RATIO: 1.4 (calc) (ref 1.0–2.5)
ALBUMIN MSPROF: 4 g/dL (ref 3.6–5.1)
ALT: 18 U/L (ref 9–46)
AST: 21 U/L (ref 10–35)
Alkaline phosphatase (APISO): 60 U/L (ref 40–115)
BUN: 9 mg/dL (ref 7–25)
CALCIUM: 9.1 mg/dL (ref 8.6–10.3)
CO2: 30 mmol/L (ref 20–32)
Chloride: 106 mmol/L (ref 98–110)
Creat: 0.91 mg/dL (ref 0.70–1.33)
GFR, EST AFRICAN AMERICAN: 108 mL/min/{1.73_m2} (ref >=60)
GFR, Est Non African American: 93 mL/min/{1.73_m2} (ref >=60)
Globulin: 2.9 g/dL (calc) (ref 1.9–3.7)
Glucose, Bld: 89 mg/dL (ref 65–99)
POTASSIUM: 4.5 mmol/L (ref 3.5–5.3)
Sodium: 140 mmol/L (ref 135–146)
TOTAL PROTEIN: 6.9 g/dL (ref 6.1–8.1)
Total Bilirubin: 0.6 mg/dL (ref 0.2–1.2)

## 2018-04-22 LAB — TSH: TSH: 3.34 mIU/L (ref 0.40–4.50)

## 2018-04-22 LAB — HEMOGLOBIN A1C
HEMOGLOBIN A1C: 5.1 %{Hb} (ref <5.7)
MEAN PLASMA GLUCOSE: 100 (calc)
eAG (mmol/L): 5.5 (calc)

## 2018-04-30 ENCOUNTER — Other Ambulatory Visit: Payer: Self-pay | Admitting: Family

## 2018-05-19 ENCOUNTER — Encounter: Payer: Self-pay | Admitting: Family Medicine

## 2018-05-20 ENCOUNTER — Encounter: Payer: Self-pay | Admitting: Family Medicine

## 2018-07-06 ENCOUNTER — Other Ambulatory Visit: Payer: Self-pay | Admitting: Internal Medicine

## 2018-07-06 DIAGNOSIS — I10 Essential (primary) hypertension: Secondary | ICD-10-CM

## 2018-07-11 ENCOUNTER — Encounter: Payer: Self-pay | Admitting: Internal Medicine

## 2018-07-18 NOTE — Telephone Encounter (Signed)
Please call patient and see if he has gotten a medication. This was forwarded to me by his previous PCP. Does he need a substitute for the irbesartan or did he get the medication. Please let me know. Gwen Her. Mannie Stabile, MD

## 2018-08-23 ENCOUNTER — Encounter: Payer: 59 | Admitting: Family Medicine

## 2018-09-21 ENCOUNTER — Ambulatory Visit (INDEPENDENT_AMBULATORY_CARE_PROVIDER_SITE_OTHER): Payer: 59 | Admitting: Family

## 2018-09-21 ENCOUNTER — Encounter: Payer: Self-pay | Admitting: Family

## 2018-09-21 VITALS — BP 148/90 | HR 76 | Temp 98.0°F | Ht 73.0 in | Wt 220.1 lb

## 2018-09-21 DIAGNOSIS — I10 Essential (primary) hypertension: Secondary | ICD-10-CM

## 2018-09-21 DIAGNOSIS — E782 Mixed hyperlipidemia: Secondary | ICD-10-CM

## 2018-09-21 DIAGNOSIS — E039 Hypothyroidism, unspecified: Secondary | ICD-10-CM | POA: Diagnosis not present

## 2018-09-21 MED ORDER — IRBESARTAN 300 MG PO TABS: 300.0000 mg | ORAL_TABLET | Freq: Every day | ORAL | 0 refills | Status: DC

## 2018-09-21 MED ORDER — LEVOTHYROXINE SODIUM 75 MCG PO TABS: 75.0000 ug | ORAL_TABLET | Freq: Every day | ORAL | 0 refills | Status: DC

## 2018-09-21 NOTE — Progress Notes (Signed)
Henry Owen is a 57 y.o. male with the following history as recorded in Epic[employer]:  Patient Active Problem List   Diagnosis Date Noted  . Low HDL (under 40) 04/19/2018  . 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 Hypothyroidism 05/02/2014  . Tobacco abuse 11/30/2011  . Routine general medical examination at a health care facility 11/27/2011  . Essential hypertension, benign 11/27/2011  . Nonspecific abnormal electrocardiogram (ECG) (EKG) 11/27/2011    Current Outpatient Medications  Medication Sig Dispense Refill  . aspirin EC 81 MG tablet Take 1 tablet (81 mg total) by mouth daily. 90 tablet 3  . cetirizine (ZYRTEC) 10 MG tablet Take 10 mg by mouth daily as needed for allergies or rhinitis.     Marland Kitchen irbesartan (AVAPRO) 300 MG tablet Take 1 tablet (300 mg total) by mouth daily. 90 tablet 0  . levothyroxine (SYNTHROID, LEVOTHROID) 75 MCG tablet Take 1 tablet (75 mcg total) by mouth daily. 90 tablet 0   No current facility-administered medications for this visit.     Allergies: Patient has no known allergies.  Past Medical History:  Diagnosis Date  . BPH with obstruction/lower urinary tract symptoms   . Enlarged prostate   . Hypertension   . Hypothyroidism   . Tobacco use   . Warts, genital     Past Surgical History:  Procedure Laterality Date  . KNEE ARTHROSCOPY  2000  . LASIK  2005  . WISDOM TOOTH EXTRACTION      Family History  Problem Relation Age of Onset  . Early death Other   . Heart disease Father        Father died of MI at age 66  . Hypertension Father   . Diabetes Mother   . Cancer Neg Hx   . Colon cancer Neg Hx   . Stomach cancer Neg Hx     Social History   Tobacco Use  . Smoking status: Current Every Day Smoker    Packs/day: 1.50    Years: 25.00    Pack years: 37.50    Types: Cigarettes  . Smokeless tobacco: Never Used  Substance Use Topics  . Alcohol use: No    Subjective:  Patient presents to follow-up on his  hypertension; needs to get set back up for his CPE; had considered transferring to provider in [place] as he lives and works there; unable to find a provider and has opted to stay in [place];  Denies any chest pain, shortness of breath, blurred vision or headache; notes that blood pressure at home is typically "well controlled"- averages 130/ mid-80s; Stopped Crestor earlier this year due to side effects; did not have his labs re-checked as recommended.     Objective:  Vitals:   09/21/18 0825  BP: (!) 148/90  Pulse: 76  Temp: 98 F (36.7 C)  TempSrc: Oral  SpO2: 97%  Weight: 220 lb 1.3 oz (99.8 kg)  Height: 6\' 1"  (1.854 m)    General: Well developed, well nourished, in no acute distress  Skin : Warm and dry.  Head: Normocephalic and atraumatic  Lungs: Respirations unlabored; clear to auscultation bilaterally without wheeze, rales, rhonchi  CVS exam: normal rate and regular rhythm.  Neurologic: Alert and oriented; speech intact; face symmetrical; moves all extremities well; CNII-XII intact without focal deficit  Assessment:  1. Mixed hyperlipidemia   2. Essential hypertension, benign   3. Hypothyroidism, unspecified type     Plan:  1. ? Control; he will check regularly and bring monitor/  readings to next OV; may need to adjust medication; follow-up with his PCP for CPE; 2. TSH was updated earlier this year; refill updated; 3. Will plan to get fasting lipids checked at next OV; could not tolerate Crestor;   He defers flu shot today;   Return for CPE with Dr. Ronnald Ramp.  No orders of the defined types were placed in this encounter.   Requested Prescriptions   Signed Prescriptions Disp Refills  . irbesartan (AVAPRO) 300 MG tablet 90 tablet 0    Sig: Take 1 tablet (300 mg total) by mouth daily.  Marland Kitchen levothyroxine (SYNTHROID, LEVOTHROID) 75 MCG tablet 90 tablet 0    Sig: Take 1 tablet (75 mcg total) by mouth daily.

## 2018-10-03 ENCOUNTER — Other Ambulatory Visit: Payer: Self-pay | Admitting: Internal Medicine

## 2018-10-03 DIAGNOSIS — E039 Hypothyroidism, unspecified: Secondary | ICD-10-CM

## 2018-10-06 ENCOUNTER — Ambulatory Visit (INDEPENDENT_AMBULATORY_CARE_PROVIDER_SITE_OTHER): Payer: 59 | Admitting: Internal Medicine

## 2018-10-06 ENCOUNTER — Encounter: Payer: Self-pay | Admitting: Internal Medicine

## 2018-10-06 VITALS — BP 172/98 | HR 82 | Temp 98.2°F | Resp 16 | Ht 73.0 in | Wt 215.5 lb

## 2018-10-06 DIAGNOSIS — I10 Essential (primary) hypertension: Secondary | ICD-10-CM

## 2018-10-06 DIAGNOSIS — L309 Dermatitis, unspecified: Secondary | ICD-10-CM | POA: Diagnosis not present

## 2018-10-06 DIAGNOSIS — R9439 Abnormal result of other cardiovascular function study: Secondary | ICD-10-CM | POA: Insufficient documentation

## 2018-10-06 DIAGNOSIS — E785 Hyperlipidemia, unspecified: Secondary | ICD-10-CM

## 2018-10-06 DIAGNOSIS — Z0000 Encounter for general adult medical examination without abnormal findings: Secondary | ICD-10-CM | POA: Diagnosis not present

## 2018-10-06 DIAGNOSIS — E039 Hypothyroidism, unspecified: Secondary | ICD-10-CM | POA: Diagnosis not present

## 2018-10-06 MED ORDER — EPICERAM EX EMUL: 1.0000 | Freq: Two times a day (BID) | CUTANEOUS | 5 refills | Status: DC

## 2018-10-06 MED ORDER — ASPIRIN EC 81 MG PO TBEC: 81.0000 mg | DELAYED_RELEASE_TABLET | Freq: Every day | ORAL | 3 refills | Status: DC

## 2018-10-06 MED ORDER — CHLORTHALIDONE 25 MG PO TABS: 25.0000 mg | ORAL_TABLET | Freq: Every day | ORAL | 0 refills | Status: DC

## 2018-10-06 NOTE — Patient Instructions (Signed)

## 2018-10-06 NOTE — Progress Notes (Signed)
Subjective:  Patient ID: Henry Owen, male    DOB: [birth year]  Age: 57 y.o. MRN: [id number]  CC: Annual Exam; Hyperlipidemia; Hypertension; Hypothyroidism; and Rash   HPI Henry Owen presents for a CPX.  He complains of a 1 year history of chest discomfort and DOE with exercise.  The symptoms have not worsened recently.  He also tells me his blood pressure has not been well controlled and he has had a few headaches.  He denies blurred vision, diaphoresis, dizziness, lightheadedness, edema, palpitations, or near syncope.  He tells me he is compliant with the ARB.  Outpatient Medications Prior to Visit  Medication Sig Dispense Refill  . cetirizine (ZYRTEC) 10 MG tablet Take 10 mg by mouth daily as needed for allergies or rhinitis.     Marland Kitchen levothyroxine (SYNTHROID, LEVOTHROID) 75 MCG tablet Take 1 tablet (75 mcg total) by mouth daily. 90 tablet 0  . aspirin EC 81 MG tablet Take 1 tablet (81 mg total) by mouth daily. 90 tablet 3  . irbesartan (AVAPRO) 300 MG tablet Take 1 tablet (300 mg total) by mouth daily. 90 tablet 0  . saw palmetto 80 MG capsule Take 80 mg by mouth 2 (two) times daily.    Marland Kitchen levothyroxine (SYNTHROID, LEVOTHROID) 75 MCG tablet TAKE 1 TABLET BY MOUTH EVERY DAY 90 tablet 0   No facility-administered medications prior to visit.     ROS Review of Systems  Constitutional: Negative for diaphoresis, fatigue and unexpected weight change.  HENT: Negative.   Eyes: Negative for visual disturbance.  Respiratory: Positive for shortness of breath. Negative for cough, choking, chest tightness and wheezing.   Cardiovascular: Positive for chest pain. Negative for palpitations and leg swelling.  Gastrointestinal: Negative for abdominal pain, constipation, diarrhea, nausea and vomiting.  Endocrine: Negative.  Negative for cold intolerance and heat intolerance.  Genitourinary: Negative.  Negative for difficulty urinating, scrotal swelling, testicular pain and urgency.    Musculoskeletal: Negative for arthralgias and myalgias.  Skin: Positive for rash. Negative for color change.       He complains of 2 areas that he picks at frequently.  One is over his right upper back and one is over his right posterior calf.   Neurological: Positive for headaches. Negative for dizziness, weakness, light-headedness and numbness.  Hematological: Negative for adenopathy. Does not bruise/bleed easily.  Psychiatric/Behavioral: Negative.     Objective:  BP (!) 172/98 (BP Location: Left Arm, Patient Position: Sitting, Cuff Size: Normal)   Pulse 82   Temp 98.2 F (36.8 C) (Oral)   Resp 16   Ht 6\' 1"  (1.854 m)   Wt 215 lb 8 oz (97.8 kg)   SpO2 97%   BMI 28.43 kg/m   BP Readings from Last 3 Encounters:  10/06/18 (!) 172/98  09/21/18 (!) 148/90  04/19/18 124/88    Wt Readings from Last 3 Encounters:  10/06/18 215 lb 8 oz (97.8 kg)  09/21/18 220 lb 1.3 oz (99.8 kg)  04/19/18 220 lb 1.9 oz (99.8 kg)    Physical Exam  Constitutional: He is oriented to person, place, and time. No distress.  HENT:  Mouth/Throat: Oropharynx is clear and moist. No oropharyngeal exudate.  Eyes: Conjunctivae are normal. No scleral icterus.  Neck: Normal range of motion. Neck supple. No JVD present. No thyromegaly present.  Cardiovascular: Normal rate, regular rhythm and normal heart sounds. Exam reveals no gallop and no friction rub.  No murmur heard. EKG ----  Sinus  Rhythm  Low voltage in limb  leads.   -Prominent R(V1) and left axis -nonspecific  -Seen with pulmonary disease -possible anterior fascicular block.   ABNORMAL - no change from the prior EKG  Pulmonary/Chest: Effort normal and breath sounds normal. No respiratory distress. He has no wheezes. He has no rales.  Abdominal: Soft. Bowel sounds are normal. He exhibits no mass. There is no hepatosplenomegaly. There is no tenderness. Hernia confirmed negative in the right inguinal area and confirmed negative in the left inguinal  area.  Genitourinary: Rectum normal, prostate normal, testes normal and penis normal. Rectal exam shows no external hemorrhoid, no internal hemorrhoid, no fissure, no mass, no tenderness, anal tone normal and guaiac negative stool. Prostate is not enlarged and not tender. Right testis shows no mass, no swelling and no tenderness. Left testis shows no mass, no swelling and no tenderness. Circumcised. No penile erythema or penile tenderness. No discharge found.  Musculoskeletal: Normal range of motion. He exhibits no edema, tenderness or deformity.  Lymphadenopathy:    He has no cervical adenopathy. No inguinal adenopathy noted on the right or left side.  Neurological: He is alert and oriented to person, place, and time.  Skin: Skin is warm and dry. Rash noted. He is not diaphoretic. No erythema. No pallor.     Psychiatric: He has a normal mood and affect. His behavior is normal. Judgment and thought content normal.  Vitals reviewed.   Lab Results  Component Value Date   WBC 9.3 02/04/2018   HGB 16.2 02/04/2018   HCT 47.0 02/04/2018   PLT 194 02/04/2018   GLUCOSE 89 04/21/2018   CHOL 163 09/22/2016   TRIG 179 (H) 09/22/2016   HDL 31 (L) 09/22/2016   LDLCALC 96 09/22/2016   ALT 18 04/21/2018   AST 21 04/21/2018   NA 140 04/21/2018   K 4.5 04/21/2018   CL 106 04/21/2018   CREATININE 0.91 04/21/2018   BUN 9 04/21/2018   CO2 30 04/21/2018   TSH 3.34 04/21/2018   PSA 0.3 09/22/2016   HGBA1C 5.1 04/21/2018    No results found.  Assessment & Plan:   Henry Owen was seen today for annual exam, hyperlipidemia, hypertension, hypothyroidism and rash.  Diagnoses and all orders for this visit:  Acquired hypothyroidism-we will check his TSH and will dose accordingly. -     TSH; Future  Essential hypertension, benign-blood pressure is not adequately well controlled.  His EKG is negative for LVH.  He will continue taking the ARB but I will also add a thiazide diuretic as well.  We will also  check his labs to screen for endorgan damage and secondary causes. -     CBC with Differential/Platelet; Future -     Comprehensive metabolic panel; Future -     Urinalysis, Routine w reflex microscopic; Future -     VITAMIN D 25 Hydroxy (Vit-D Deficiency, Fractures); Future -     EKG 12-Lead -     chlorthalidone (HYGROTON) 25 MG tablet; Take 1 tablet (25 mg total) by mouth daily. -     Aldosterone + renin activity w/ ratio; Future  Routine general medical examination at a health care - Exam completed, labs ordered, he refused a flu vaccine, colon cancer screening is up-to-date, patient education materials were given. -     Lipid panel; Future -     PSA; Future  Eczema, unspecified type -     Dermatological Products, Misc. [facility]) lotion; Apply 1 Act topically 2 (two) times daily with a meal.  Hyperlipidemia  LDL goal <70- I will check his LDL and if his ASCVD risk score is greater than 15% will start a statin for CV risk reduction.  For now I have asked him to go ahead and start a baby aspirin every day. -     aspirin EC 81 MG tablet; Take 1 tablet (81 mg total) by mouth daily.  Abnormal stress echocardiogram- In reviewing his chart he had a stress echo done in 2012 and the report was positive for ischemia.  I will check a troponin.  If it is positive then will refer him urgently but for now I have asked him to have a follow-up with cardiology to see if this needs to be investigated again since he is symptomatic. -     Troponin I; Future -     Ambulatory referral to Cardiology   I have discontinued Dellis Filbert Wegener's saw palmetto. I am also having him start on EPICERAM and chlorthalidone. Additionally, I am having him maintain his cetirizine, levothyroxine, and aspirin EC.  Meds ordered this encounter  Medications  . Dermatological Products, Misc. [facility]) lotion    Sig: Apply 1 Act topically 2 (two) times daily with a meal.    Dispense:  100 g    Refill:  5  . chlorthalidone  (HYGROTON) 25 MG tablet    Sig: Take 1 tablet (25 mg total) by mouth daily.    Dispense:  90 tablet    Refill:  0  . aspirin EC 81 MG tablet    Sig: Take 1 tablet (81 mg total) by mouth daily.    Dispense:  90 tablet    Refill:  3     Follow-up: Return in about 4 weeks (around 11/03/2018).  [name], MD

## 2018-10-07 ENCOUNTER — Other Ambulatory Visit: Payer: Self-pay | Admitting: Family

## 2018-10-07 DIAGNOSIS — I10 Essential (primary) hypertension: Secondary | ICD-10-CM

## 2018-10-07 MED ORDER — IRBESARTAN 300 MG PO TABS: 300.0000 mg | ORAL_TABLET | Freq: Every day | ORAL | 1 refills | Status: DC

## 2018-10-17 ENCOUNTER — Encounter: Payer: Self-pay | Admitting: Internal Medicine

## 2018-10-17 ENCOUNTER — Telehealth: Payer: Self-pay | Admitting: Internal Medicine

## 2018-10-17 NOTE — Addendum Note (Signed)
Addended by: Karren Cobble on: 10/17/2018 09:46 AM   Modules accepted: Orders

## 2018-10-17 NOTE — Addendum Note (Signed)
Addended by: Karren Cobble on: 10/17/2018 09:52 AM   Modules accepted: Orders

## 2018-10-17 NOTE — Telephone Encounter (Signed)
Copied from [place] [id number]. Topic: General - Other >> Oct 17, 2018  9:01 AM Oneta Rack wrote: Caller name: [place]  Relation to pt: [facility]  Call back number: [phone] fax # [phone]    Reason for call:  Patient currently at [facility] requesting lab orders (chart reflects) please fax.

## 2018-10-18 NOTE — Telephone Encounter (Signed)
Orders entered and [facility]ed to [facility] per pt request.

## 2018-10-26 ENCOUNTER — Ambulatory Visit: Payer: Self-pay

## 2018-10-26 ENCOUNTER — Telehealth: Payer: Self-pay | Admitting: Internal Medicine

## 2018-10-26 NOTE — Telephone Encounter (Signed)
See triage encounter.

## 2018-10-26 NOTE — Telephone Encounter (Signed)
Copied from [place] [id number]. Topic: General - Other >> Oct 26, 2018 11:51 AM Yvette Rack wrote: Reason for CRM: Pt states he has been experiencing the following symptoms since his last office visit: chest congestion, chills, sweating, loss of appetite, and weight loss of about 10 pounds in 2 weeks time. Pt requests call back from Dr. Ronnald Ramp' Rumson or nurse. Cb# [phone]

## 2018-10-26 NOTE — Telephone Encounter (Signed)
Returned call to patient. Pt called with C/O cough that is lingering x1 week. He has tried OTC coricidin. Pt states that he has not felt good for a while with C/O no appetite.  Pt states he had a runny nose with this cough. He has not checked for fever but feels chilled or sweaty. It depends. He has lost weight but thinks that its a new medication he takes for fluid. Pt is concerned about his health stating that he has an appointment scheduled with cardiology. Appointment scheduled per protocol. Care advice read to patient. Pt verbalized understanding of all instructions Reason for Disposition . Cough has been present for > 3 weeks  Answer Assessment - Initial Assessment Questions 1. ONSET: "When did the cough begin?"      Last week Tuesday or wednesday 2. SEVERITY: "How bad is the cough today?"      Off an on cough 3. RESPIRATORY DISTRESS: "Describe your breathing."      none 4. FEVER: "Do you have a fever?" If so, ask: "What is your temperature, how was it measured, and when did it start?"     Feels feverish and sweaty 5. HEMOPTYSIS: "Are you coughing up any blood?" If so ask: "How much?" (flecks, streaks, tablespoons, etc.)     Nothing coming up generally clear 6. TREATMENT: "What have you done so far to treat the cough?" (e.g., meds, fluids, humidifier)     coricedin chest congestion and cough 7. CARDIAC HISTORY: "Do you have any history of heart disease?" (e.g., heart attack, congestive heart failure)      BP elevated 8. LUNG HISTORY: "Do you have any history of lung disease?"  (e.g., pulmonary embolus, asthma, emphysema)     no 9. PE RISK FACTORS: "Do you have a history of blood clots?" (or: recent major surgery, recent prolonged travel, bedridden)     no 10. OTHER SYMPTOMS: "Do you have any other symptoms? (e.g., runny nose, wheezing, chest pain)       Runny nose 11. PREGNANCY: "Is there any chance you are pregnant?" "When was your last menstrual period?"    N/A 12. TRAVEL: "Have  you traveled out of the country in the last month?" (e.g., travel history, exposures)       No  Protocols used: COUGH - ACUTE NON-PRODUCTIVE-A-AH

## 2018-10-27 ENCOUNTER — Encounter: Payer: Self-pay | Admitting: Cardiovascular Disease

## 2018-10-27 ENCOUNTER — Ambulatory Visit (INDEPENDENT_AMBULATORY_CARE_PROVIDER_SITE_OTHER): Payer: 59 | Admitting: Family

## 2018-10-27 VITALS — BP 140/82 | HR 90 | Temp 98.3°F | Ht 73.0 in | Wt 205.0 lb

## 2018-10-27 DIAGNOSIS — J209 Acute bronchitis, unspecified: Secondary | ICD-10-CM | POA: Diagnosis not present

## 2018-10-27 MED ORDER — AZITHROMYCIN 250 MG PO TABS: ORAL_TABLET | ORAL | 0 refills | Status: DC

## 2018-10-27 MED ORDER — BUDESONIDE-FORMOTEROL FUMARATE 160-4.5 MCG/ACT IN AERO: 2.0000 | INHALATION_SPRAY | Freq: Two times a day (BID) | RESPIRATORY_TRACT | 0 refills | Status: DC

## 2018-10-27 NOTE — Progress Notes (Signed)
Henry Owen is a 57 y.o. male with the following history as recorded in Epic[employer]:  Patient Active Problem List   Diagnosis Date Noted  . Eczema 10/06/2018  . Abnormal stress echocardiogram 10/06/2018  . 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 Hyperlipidemia LDL goal <70 10/01/2016  . Hypothyroidism 05/02/2014  . Tobacco abuse 11/30/2011  . Routine general medical examination at a health care facility 11/27/2011  . Essential hypertension, benign 11/27/2011  . Nonspecific abnormal electrocardiogram (ECG) (EKG) 11/27/2011    Current Outpatient Medications  Medication Sig Dispense Refill  . aspirin EC 81 MG tablet Take 1 tablet (81 mg total) by mouth daily. 90 tablet 3  . cetirizine (ZYRTEC) 10 MG tablet Take 10 mg by mouth daily as needed for allergies or rhinitis.     . chlorthalidone (HYGROTON) 25 MG tablet Take 1 tablet (25 mg total) by mouth daily. 90 tablet 0  . Dermatological Products, Misc. [facility]) lotion Apply 1 Act topically 2 (two) times daily with a meal. 100 g 5  . irbesartan (AVAPRO) 300 MG tablet Take 1 tablet (300 mg total) by mouth daily. 90 tablet 1  . levothyroxine (SYNTHROID, LEVOTHROID) 75 MCG tablet Take 1 tablet (75 mcg total) by mouth daily. 90 tablet 0   No current facility-administered medications for this visit.     Allergies: Patient has no known allergies.  Past Medical History:  Diagnosis Date  . BPH with obstruction/lower urinary tract symptoms   . Enlarged prostate   . Hypertension   . Hypothyroidism   . Tobacco use   . Warts, genital     Past Surgical History:  Procedure Laterality Date  . KNEE ARTHROSCOPY  2000  . LASIK  2005  . WISDOM TOOTH EXTRACTION      Family History  Problem Relation Age of Onset  . Early death Other   . Heart disease Father        Father died of MI at age 33  . Hypertension Father   . Diabetes Mother   . Cancer Neg Hx   . Colon cancer Neg Hx   . Stomach cancer Neg Hx     Social  History   Tobacco Use  . Smoking status: Current Every Day Smoker    Packs/day: 1.50    Years: 25.00    Pack years: 37.50    Types: Cigarettes  . Smokeless tobacco: Never Used  Substance Use Topics  . Alcohol use: No    Subjective:  Patient presents with concerns for cough/ chest congestion x 10 days; "just can't shake it" ; Denies any chest pain but does have shortness of breath; has not had any smoked this week- smoker for 30+ years; using OTC Coricidin with limited benefit. No fever;    Objective:  Vitals:   10/27/18 1437  BP: 140/82  Pulse: 90  Temp: 98.3 F (36.8 C)  TempSrc: Oral  SpO2: 97%  Weight: 205 lb (93 kg)  Height: 6\' 1"  (1.854 m)    General: Well developed, well nourished, in no acute distress  Skin : Warm and dry.  Head: Normocephalic and atraumatic  Eyes: Sclera and conjunctiva clear; pupils round and reactive to light; extraocular movements intact  Ears: External normal; canals clear; tympanic membranes normal  Oropharynx: Pink, supple. No suspicious lesions  Neck: Supple without thyromegaly, adenopathy  Lungs: Respirations unlabored; wheeze noted in lower left lung  CVS exam: normal rate, regular rhythm,  Neurologic: Alert and oriented; speech intact; face symmetrical; moves  all extremities well; CNII-XII intact without focal deficit   Assessment:  1. Acute bronchitis, unspecified organism     Plan:  Discussed updated CXR today- patient defers today and will discuss with his PCP at follow up next week; Rx for Z-pak, Symbicort; increase fluids, rest; needs to quit smoking completely;   No follow-ups on file.  No orders of the defined types were placed in this encounter.   Requested Prescriptions    No prescriptions requested or ordered in this encounter

## 2018-10-31 LAB — CBC AND DIFFERENTIAL
HEMATOCRIT: 47 (ref 41–53)
Hemoglobin: 16.6 (ref 13.5–17.5)
PLATELETS: 235 (ref 150–399)
WBC: 11.4

## 2018-10-31 LAB — PSA: PSA: 0.3

## 2018-10-31 LAB — TSH: TSH: 2.45 (ref 0.41–5.90)

## 2018-10-31 LAB — BASIC METABOLIC PANEL
BUN: 14 (ref 4–21)
CREATININE: 0.9 (ref 0.6–1.3)
Glucose: 98
Potassium: 4.3 (ref 3.4–5.3)
Sodium: 133 — AB (ref 137–147)

## 2018-10-31 LAB — LIPID PANEL
Cholesterol: 155 (ref 0–200)
HDL: 47 (ref 35–70)
LDL Cholesterol: 81
Triglycerides: 141 (ref 40–160)

## 2018-10-31 LAB — VITAMIN D 25 HYDROXY (VIT D DEFICIENCY, FRACTURES): Vit D, 25-Hydroxy: 32

## 2018-11-01 ENCOUNTER — Encounter: Payer: 59 | Admitting: Internal Medicine

## 2018-11-03 ENCOUNTER — Ambulatory Visit: Payer: 59 | Admitting: Internal Medicine

## 2018-11-16 ENCOUNTER — Encounter: Payer: Self-pay | Admitting: Internal Medicine

## 2018-11-16 ENCOUNTER — Ambulatory Visit (INDEPENDENT_AMBULATORY_CARE_PROVIDER_SITE_OTHER): Payer: 59 | Admitting: Internal Medicine

## 2018-11-16 VITALS — BP 136/80 | HR 91 | Temp 99.2°F | Ht 73.0 in | Wt 209.0 lb

## 2018-11-16 DIAGNOSIS — E785 Hyperlipidemia, unspecified: Secondary | ICD-10-CM

## 2018-11-16 DIAGNOSIS — R9439 Abnormal result of other cardiovascular function study: Secondary | ICD-10-CM

## 2018-11-16 DIAGNOSIS — E039 Hypothyroidism, unspecified: Secondary | ICD-10-CM | POA: Diagnosis not present

## 2018-11-16 DIAGNOSIS — I10 Essential (primary) hypertension: Secondary | ICD-10-CM

## 2018-11-16 DIAGNOSIS — Z0000 Encounter for general adult medical examination without abnormal findings: Secondary | ICD-10-CM

## 2018-11-16 MED ORDER — PITAVASTATIN CALCIUM 1 MG PO TABS: 1.0000 | ORAL_TABLET | Freq: Every day | ORAL | 1 refills | Status: DC

## 2018-11-16 NOTE — Progress Notes (Signed)
Subjective:  Patient ID: Henry Owen, male    DOB: [birth year]  Age: 57 y.o. MRN: [id number]  CC: Hypertension   HPI Henry Owen presents for a BP check - He has checked his blood pressure at home and he tells me it has been well controlled on the combination of chlorthalidone and irbesartan.  He finally had his labs done at [facility].  He continues to complain of episodes of chest pain that occur at rest and with exertion.  When I previous saw him he described the pain is being on the left side but now he is complaining of chest pain under his sternum and on the right side that he describes as intermittent "twinges."  He has quit smoking since I last saw him.  Outpatient Medications Prior to Visit  Medication Sig Dispense Refill  . aspirin EC 81 MG tablet Take 1 tablet (81 mg total) by mouth daily. 90 tablet 3  . chlorthalidone (HYGROTON) 25 MG tablet Take 1 tablet (25 mg total) by mouth daily. 90 tablet 0  . irbesartan (AVAPRO) 300 MG tablet Take 1 tablet (300 mg total) by mouth daily. 90 tablet 1  . levothyroxine (SYNTHROID, LEVOTHROID) 75 MCG tablet Take 1 tablet (75 mcg total) by mouth daily. 90 tablet 0  . azithromycin (ZITHROMAX) 250 MG tablet 2 tabs po qd x 1 day; 1 tablet per day x 4 days; 6 tablet 0  . budesonide-formoterol (SYMBICORT) 160-4.5 MCG/ACT inhaler Inhale 2 puffs into the lungs 2 (two) times daily. 1 Inhaler 0  . cetirizine (ZYRTEC) 10 MG tablet Take 10 mg by mouth daily as needed for allergies or rhinitis.     . Dermatological Products, Misc. [facility]) lotion Apply 1 Act topically 2 (two) times daily with a meal. 100 g 5   No facility-administered medications prior to visit.     ROS Review of Systems  Constitutional: Negative for diaphoresis and fatigue.  HENT: Negative.   Eyes: Negative.   Respiratory: Negative for cough, chest tightness, shortness of breath and wheezing.   Cardiovascular: Positive for chest pain. Negative for leg swelling.    Gastrointestinal: Negative for abdominal pain, constipation, diarrhea, nausea and vomiting.  Genitourinary: Negative.  Negative for difficulty urinating.  Musculoskeletal: Negative.  Negative for arthralgias and myalgias.  Skin: Negative.   Neurological: Negative.  Negative for dizziness, weakness and light-headedness.  Hematological: Negative for adenopathy. Does not bruise/bleed easily.  Psychiatric/Behavioral: Negative.     Objective:  BP 136/80 (BP Location: Left Arm, Patient Position: Sitting, Cuff Size: Normal)   Pulse 91   Temp 99.2 F (37.3 C) (Oral)   Ht 6\' 1"  (1.854 m)   Wt 209 lb (94.8 kg)   SpO2 91%   BMI 27.57 kg/m   BP Readings from Last 3 Encounters:  11/16/18 136/80  10/27/18 140/82  10/06/18 (!) 172/98    Wt Readings from Last 3 Encounters:  11/16/18 209 lb (94.8 kg)  10/27/18 205 lb (93 kg)  10/06/18 215 lb 8 oz (97.8 kg)    Physical Exam  Constitutional: He is oriented to person, place, and time. No distress.  HENT:  Mouth/Throat: Oropharynx is clear and moist. No oropharyngeal exudate.  Eyes: Conjunctivae are normal. No scleral icterus.  Neck: Normal range of motion. Neck supple. No JVD present. No thyromegaly present.  Cardiovascular: Normal rate, regular rhythm and normal heart sounds. Exam reveals no gallop.  No murmur heard. Pulmonary/Chest: Effort normal and breath sounds normal. He has no wheezes. He has no  rales. He exhibits no tenderness.  Abdominal: Soft. Bowel sounds are normal. He exhibits no mass. There is no hepatosplenomegaly. There is no tenderness.  Musculoskeletal: Normal range of motion. He exhibits no edema, tenderness or deformity.  Neurological: He is alert and oriented to person, place, and time.  Skin: Skin is warm and dry. No rash noted. He is not diaphoretic.  Vitals reviewed.   Lab Results  Component Value Date   WBC 11.4 10/31/2018   HGB 16.6 10/31/2018   HCT 47 10/31/2018   PLT 235 10/31/2018   GLUCOSE 89  04/21/2018   CHOL 155 10/31/2018   TRIG 141 10/31/2018   HDL 47 10/31/2018   LDLCALC 81 10/31/2018   ALT 18 04/21/2018   AST 21 04/21/2018   NA 133 (A) 10/31/2018   K 4.3 10/31/2018   CL 106 04/21/2018   CREATININE 0.9 10/31/2018   BUN 14 10/31/2018   CO2 30 04/21/2018   TSH 2.45 10/31/2018   PSA 0.3 10/31/2018   HGBA1C 5.1 04/21/2018    No results found.  Assessment & Plan:   Henry Owen was seen today for hypertension.  Diagnoses and all orders for this visit:  Essential hypertension, benign- His blood pressure is adequately well controlled on the current combination.  Screening for primary aldosteronism was negative. -     Cancel: CBC with Differential/Platelet; Future -     Cancel: Comprehensive metabolic panel; Future -     Cancel: Urinalysis, Routine w reflex microscopic; Future -     Cancel: VITAMIN D 25 Hydroxy (Vit-D Deficiency, Fractures); Future  Acquired hypothyroidism- His TSH is in the normal range.  He will remain on the current dose of levothyroxine. -     Cancel: TSH; Future  Hyperlipidemia LDL goal <70- I have asked him to start taking a statin for CV risk reduction. -     Cancel: Comprehensive metabolic panel; Future -     Pitavastatin Calcium (LIVALO) 1 MG TABS; Take 1 tablet (1 mg total) by mouth daily.  Routine general medical examination at a health care facility -     Cancel: Lipid panel; Future -     Cancel: PSA; Future  Abnormal stress echocardiogram- He had a stress echo done 7 years ago that was reported as being abnormal.  His current description of chest pain does not sound like it is cardiac ischemia.  He has an appointment with cardiology next week to revisit this.   I have discontinued Henry Owen's cetirizine, EPICERAM, azithromycin, and budesonide-formoterol. I am also having him start on Pitavastatin Calcium. Additionally, I am having him maintain his levothyroxine, chlorthalidone, aspirin EC, and irbesartan.  Meds ordered this  encounter  Medications  . Pitavastatin Calcium (LIVALO) 1 MG TABS    Sig: Take 1 tablet (1 mg total) by mouth daily.    Dispense:  90 tablet    Refill:  1     Follow-up: No follow-ups on file.  Henry Calico, MD

## 2018-11-16 NOTE — Patient Instructions (Signed)

## 2018-11-22 NOTE — Progress Notes (Signed)
Cardiology Office Note   Date:  11/24/2018   ID:  Henry Owen, DOB 1961/09/12, MRN [id number]  PCP:  Janith Lima, MD  Cardiologist:   Jenkins Rouge, MD   No chief complaint on file.     History of Present Illness: Henry Owen is a 57 y.o. male who presents for consultation regarding abnormal stress echo. CRF;s include HTN and smoking Referred by Dr Ronnald Ramp.  Reveiwed office visit from 11/16/18  complain of episodes of chest pain that occur at rest and with exertion. Described the pain is being on the left side but now he is complaining of chest pain under his sternum and on the right side that he describes as intermittent "twinges."  Seen by Dr Stanford Breed in 2012 Had stress echo. ECG portion abnormal but TTE images were normal Does not look like this was worked up further .  He continues to have atypical chest pains weekly Doing better with diet And trying to walk more Quit smoking 4 weeks ago     Past Medical History:  Diagnosis Date  . BPH with obstruction/lower urinary tract symptoms   . Enlarged prostate   . Hypertension   . Hypothyroidism   . Tobacco use   . Warts, genital     Past Surgical History:  Procedure Laterality Date  . KNEE ARTHROSCOPY  2000  . LASIK  2005  . WISDOM TOOTH EXTRACTION       Current Outpatient Medications  Medication Sig Dispense Refill  . aspirin EC 81 MG tablet Take 1 tablet (81 mg total) by mouth daily. 90 tablet 3  . chlorthalidone (HYGROTON) 25 MG tablet Take 1 tablet (25 mg total) by mouth daily. 90 tablet 0  . irbesartan (AVAPRO) 300 MG tablet Take 1 tablet (300 mg total) by mouth daily. 90 tablet 1  . levothyroxine (SYNTHROID, LEVOTHROID) 75 MCG tablet Take 1 tablet (75 mcg total) by mouth daily. 90 tablet 0  . Pitavastatin Calcium (LIVALO) 1 MG TABS Take 1 tablet (1 mg total) by mouth daily. 90 tablet 1   No current facility-administered medications for this visit.     Allergies:   Patient has no known allergies.     Social History:  The patient  reports that he quit smoking about 3 weeks ago. His smoking use included cigarettes. He has a 37.50 pack-year smoking history. He has never used smokeless tobacco. He reports that he does not drink alcohol or use drugs.   Family History:  The patient's family history includes Diabetes in his mother; Early death in an other family member; Heart disease in his father; Hypertension in his father.    ROS:  Please see the history of present illness.   Otherwise, review of systems are positive for none.   All other systems are reviewed and negative.    PHYSICAL EXAM: VS:  BP 138/80   Pulse 86   Ht 6' (1.829 m)   Wt 216 lb (98 kg)   SpO2 97%   BMI 29.29 kg/m  , BMI Body mass index is 29.29 kg/m. Affect appropriate Healthy:  appears stated age 46: normal Neck supple with no adenopathy JVP normal no bruits no thyromegaly Lungs clear with no wheezing and good diaphragmatic motion Heart:  S1/S2 no murmur, no rub, gallop or click PMI normal Abdomen: benighn, BS positve, no tenderness, no AAA no bruit.  No HSM or HJR Distal pulses intact with no bruits No edema Neuro non-focal Skin warm and dry No muscular  weakness    EKG:  SR LAD tall R in V1  10/06/18     Recent Labs: 04/21/2018: ALT 18 10/31/2018: BUN 14; Creatinine 0.9; Hemoglobin 16.6; Platelets 235; Potassium 4.3; Sodium 133; TSH 2.45    Lipid Panel    Component Value Date/Time   CHOL 155 10/31/2018   TRIG 141 10/31/2018   HDL 47 10/31/2018   CHOLHDL 5.3 (H) 09/22/2016 0739   VLDL 36 (H) 09/22/2016 0739   LDLCALC 81 10/31/2018      Wt Readings from Last 3 Encounters:  11/24/18 216 lb (98 kg)  11/16/18 209 lb (94.8 kg)  10/27/18 205 lb (93 kg)      Other studies Reviewed: Additional studies/ records that were reviewed today include: Notes cardiology Dr Stanford Breed 2012 Stress Echo, notes Dr Ronnald Ramp primary ECG and labs .    ASSESSMENT AND PLAN:  1.  Chest Pain.  History of  HTN and smoking abnormal ECG response on stress echo 2012 but normal images Still With atypical pains Favor anatomical test with cardiac CT scan  2. HTN:  Well controlled.  Continue current medications and low sodium Dash type diet.   3. Smoking congratulated him on cessation lung windows will be evaluated on CT  4. HLD:  Continue statin LDL is 81 reasonable unless vascular disease documented    Current medicines are reviewed at length with the patient today.  The patient does not have concerns regarding medicines.  The following changes have been made:  no change  Labs/ tests ordered today include: Cardiac CT / BMET  No orders of the defined types were placed in this encounter.    Disposition:   FU with cardiology PRN  If CTA normal     Signed, Jenkins Rouge, MD  11/24/2018 8:29 AM    [facility] [address], [place], [place]  [address] Phone: [phone]; Fax: [phone]

## 2018-11-24 ENCOUNTER — Ambulatory Visit (INDEPENDENT_AMBULATORY_CARE_PROVIDER_SITE_OTHER): Payer: 59 | Admitting: Cardiovascular Disease

## 2018-11-24 VITALS — BP 138/80 | HR 86 | Ht 72.0 in | Wt 216.0 lb

## 2018-11-24 DIAGNOSIS — R9439 Abnormal result of other cardiovascular function study: Secondary | ICD-10-CM

## 2018-11-24 DIAGNOSIS — R079 Chest pain, unspecified: Secondary | ICD-10-CM

## 2018-11-24 MED ORDER — METOPROLOL TARTRATE 50 MG PO TABS: ORAL_TABLET | ORAL | 0 refills | Status: DC

## 2018-11-24 NOTE — Patient Instructions (Addendum)
Medication Instructions:   If you need a refill on your cardiac medications before your next appointment, please call your pharmacy.   Lab work:  If you have labs (blood work) drawn today and your tests are completely normal, you will receive your results only by: Marland Kitchen MyChart Message (if you have MyChart) OR . A paper copy in the mail If you have any lab test that is abnormal or we need to change your treatment, we will call you to review the results.  Testing/Procedures: Your physician has requested that you have cardiac CT. Cardiac computed tomography (CT) is a painless test that uses an x-ray machine to take clear, detailed pictures of your heart. For further information please visit [email or web address][facility]tn. Please follow instruction sheet as given.  Follow-Up: At [facility], you and your health needs are our priority.  As part of our continuing mission to provide you with exceptional heart care, we have created designated Provider Care Teams.  These Care Teams include your primary Cardiologist (physician) and Advanced Practice Providers (APPs -  Physician Assistants and Nurse Practitioners) who all work together to provide you with the care you need, when you need it. Your physician recommends that you schedule a follow-up appointment as needed with Dr. Johnsie Cancel.   Please arrive at the [facility] main entrance of [facility] at xx:xx AM (30-45 minutes prior to test start time)  [facility] [place], [place] [address] [phone]  Proceed to the [facility] Radiology Department (First Floor).  Please follow these instructions carefully (unless otherwise directed):  Hold all erectile dysfunction medications at least 48 hours prior to test.  On the Night Before the Test: . Be sure to Drink plenty of water. . Do not consume any caffeinated/decaffeinated beverages or chocolate 12 hours prior to your test. . Do not take any antihistamines 12 hours  prior to your test.  On the Day of the Test: . Drink plenty of water. Do not drink any water within one hour of the test. . Do not eat any food 4 hours prior to the test. . You may take your regular medications prior to the test.  . Take metoprolol (Lopressor) two hours prior to test  After the Test: . Drink plenty of water. . After receiving IV contrast, you may experience a mild flushed feeling. This is normal. . On occasion, you may experience a mild rash up to 24 hours after the test. This is not dangerous. If this occurs, you can take Benadryl 25 mg and increase your fluid intake. . If you experience trouble breathing, this can be serious. If it is severe call 911 IMMEDIATELY. If it is mild, please call our office.

## 2018-12-21 ENCOUNTER — Telehealth (HOSPITAL_COMMUNITY): Payer: Self-pay | Admitting: Emergency Medicine

## 2018-12-21 NOTE — Telephone Encounter (Signed)
Left message on voicemail with name and callback number Shandrea Lusk RN Navigator Cardiac Imaging [phone] 

## 2018-12-21 NOTE — Telephone Encounter (Signed)
Pt returning phone call-  pt verbalizes understanding of appt date/time, parking situation and where to check in, pre-test NPO status and medications ordered, and verified current allergies; name and call back number provided for further questions should they arise Marchia Bond RN Navigator Cardiac Imaging [phone]

## 2018-12-22 ENCOUNTER — Ambulatory Visit (HOSPITAL_COMMUNITY)
Admission: RE | Admit: 2018-12-22 | Discharge: 2018-12-22 | Disposition: A | Discharge status: Home / Self Care | Payer: 59 | Source: Ambulatory Visit | Attending: Cardiovascular Disease | Admitting: Cardiovascular Disease

## 2018-12-22 ENCOUNTER — Encounter (HOSPITAL_COMMUNITY): Payer: Self-pay

## 2018-12-22 DIAGNOSIS — R079 Chest pain, unspecified: Secondary | ICD-10-CM

## 2018-12-22 DIAGNOSIS — R9439 Abnormal result of other cardiovascular function study: Secondary | ICD-10-CM | POA: Diagnosis present

## 2018-12-22 IMAGING — CT CT HEART MORP W/ CTA COR W/ SCORE W/ CA W/CM &/OR W/O CM
4 of 7 series · 8 of 20 positions shown, 9 images · IV contrast (APPLIED)
Comparison: None.

Addendum:
CLINICAL DATA: Chest pain

EXAM:
Cardiac CTA
MEDICATIONS:
Sub lingual nitro. 4mg and lopressor 5mg
TECHNIQUE: The patient was scanned on a Siemens Force [REDACTED]ice scanner. Gantry
rotation speed was 250 msecs. Collimation was .6 mm. A 100 kV
prospective scan was triggered in the ascending thoracic aorta at
140 HU's Full mA was used between 35% and 75% of the R-R interval.
Average HR during the scan was 68 bpm. The 3D data set was
interpreted on a dedicated work station using MPR, MIP and VRT
modes. A total of 80cc of contrast was used.

[Series 7: best diast 77 % · axial · 0.39mm/px · z∈[+1166,+1210]mm · 2 of 331 slices shown, 3 images]
[im 111/331  vessel]
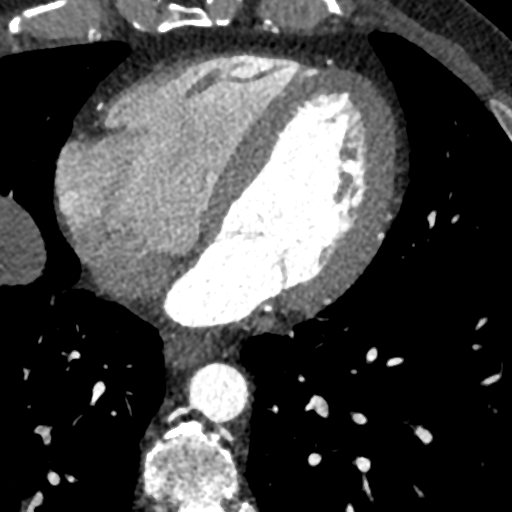
[im 111/331  lung]
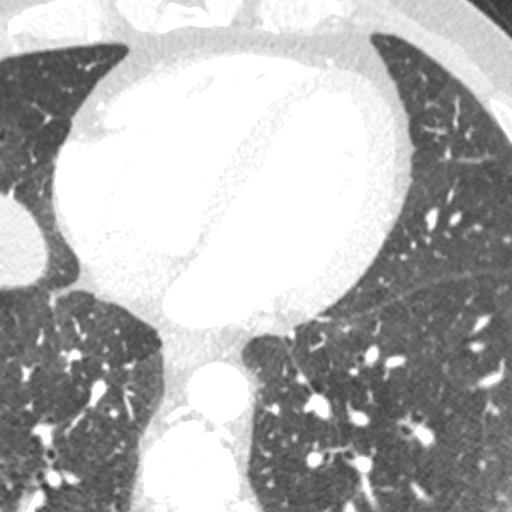
[im 221/331  vessel]
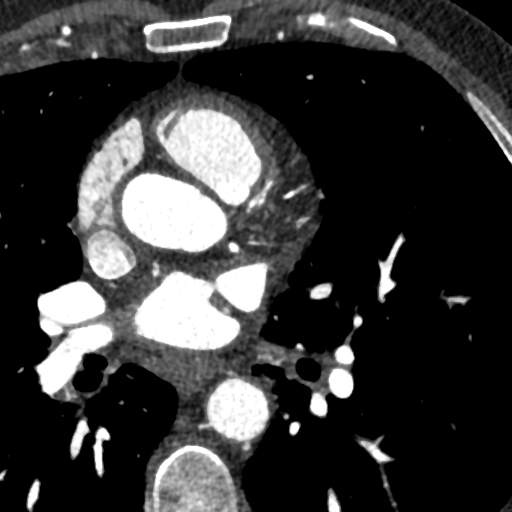

[Series 8: best syst 36 % · axial · 0.39mm/px · z∈[+1166,+1210]mm · 2 of 331 slices shown]
[im 111/331  vessel]
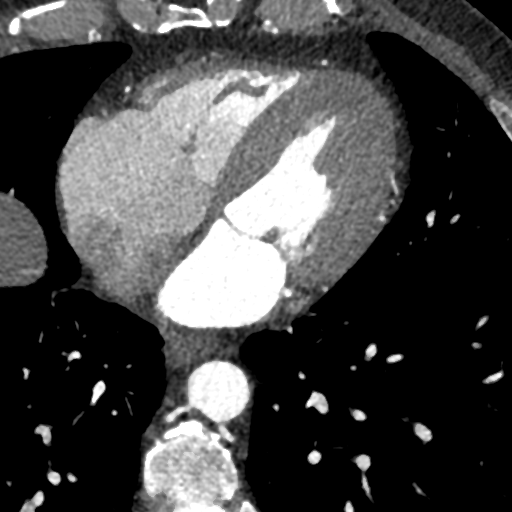
[im 221/331  vessel]
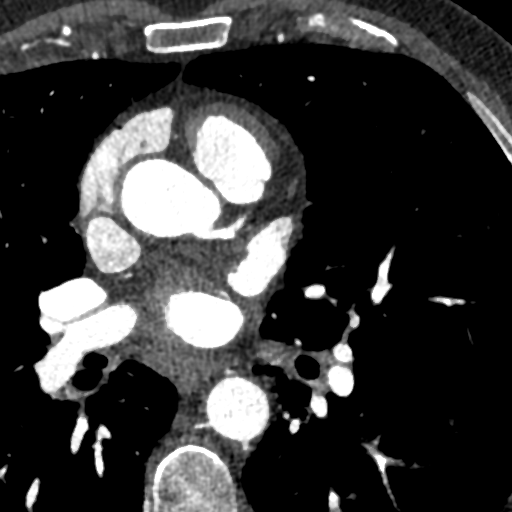

[Series 9: ts diast sharp 77 % · axial · 0.39mm/px · z∈[+1166,+1210]mm · 2 of 331 slices shown]
[im 111/331  lung]
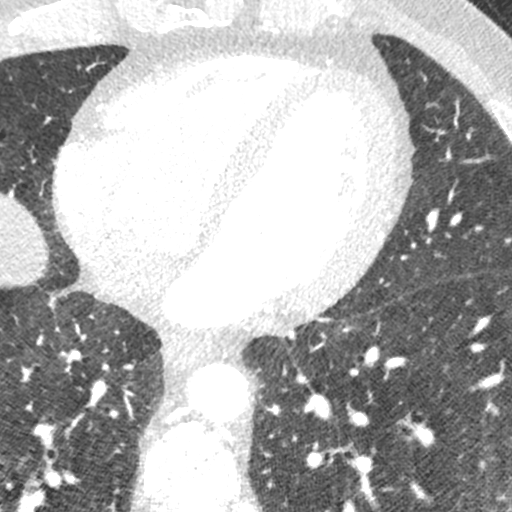
[im 221/331  lung]
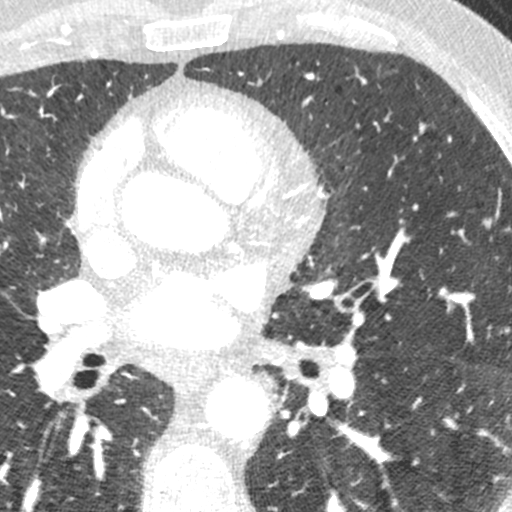

[Series 10: ts syst sharp 36 % · axial · 0.39mm/px · z∈[+1166,+1210]mm · 2 of 331 slices shown]
[im 111/331  lung]
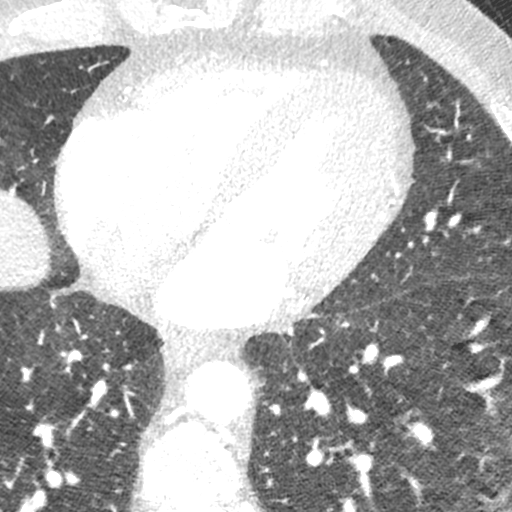
[im 221/331  lung]
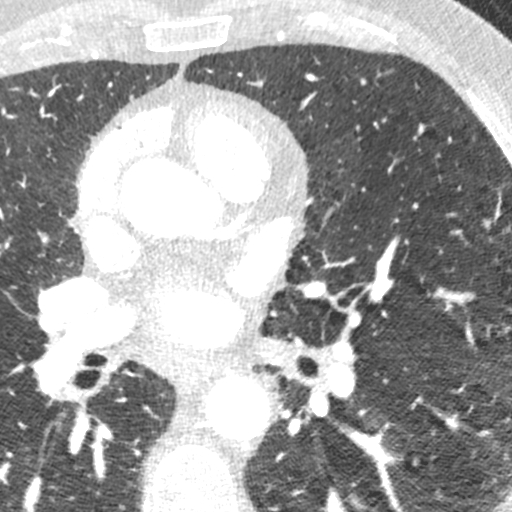

[8 of 20 positions shown; findings below may reference images not displayed]

FINDINGS: Non-cardiac: See separate report from [REDACTED]. No
significant findings on limited lung and soft tissue windows.

Calcium Score: 3 vessel coronary calcium noted

Coronary Arteries: Left  dominant with no anomalies

LM: Normal

LAD: 50% or less multiple mixed plaque in mid vessel.

IM: 50% multiple calcific plaque

D1: 50% multiple calcific plaque

D2: Normal

Circumflex: 50% calcific plaque in mid vessel

OM1: 50% calcific plaque

OM2: Normal

PDA: Normal

PLB: Normal

RCA: Less than 30% calcific plaque proximally
IMPRESSION: 1. Calcium score 304 which is 90 th percentile for age and sex

2. Left dominant coronary arteries with moderate appearing stenosis
in mid LAD/Circumflex as well as IM/D1 and OM1 study to be sent for
FFR CT

3.  Normal aortic root 3.5 cm

TREACY

EXAM:
OVER-READ INTERPRETATION  CT CHEST

The following report is an over-read performed by radiologist Dr.
TREACY [REDACTED] on [DATE]. This over-read
does not include interpretation of cardiac or coronary anatomy or
pathology. The coronary CTA interpretation by the cardiologist is
attached.
FINDINGS: Heart is normal size. Visualized aorta is normal caliber. No
adenopathy in the lower mediastinum or hila. Small hiatal hernia.
Dependent atelectasis in the lower lobes. No confluent opacities or
effusions.

Imaging into the upper abdomen shows no acute findings. Chest wall
soft tissues are unremarkable. No acute bony abnormality.
IMPRESSION: No acute or significant extracardiac abnormality.

*** End of Addendum ***

## 2018-12-22 MED ORDER — METOPROLOL TARTRATE 5 MG/5ML IV SOLN
5.0000 mg | INTRAVENOUS | Status: AC | PRN
  Administered 2018-12-22 (×4): 5 mg via INTRAVENOUS
  Filled 2018-12-22 (×5): qty 5

## 2018-12-22 MED ORDER — METOPROLOL TARTRATE 5 MG/5ML IV SOLN
INTRAVENOUS | Status: AC
  Administered 2018-12-22: 5 mg via INTRAVENOUS
  Filled 2018-12-22: qty 15

## 2018-12-22 MED ORDER — METOPROLOL TARTRATE 5 MG/5ML IV SOLN
INTRAVENOUS | Status: AC
  Filled 2018-12-22: qty 10

## 2018-12-22 MED ORDER — NITROGLYCERIN 0.4 MG SL SUBL
SUBLINGUAL_TABLET | SUBLINGUAL | Status: AC
  Administered 2018-12-22: 0.8 mg via SUBLINGUAL
  Filled 2018-12-22: qty 2

## 2018-12-22 MED ORDER — NITROGLYCERIN 0.4 MG SL SUBL
0.8000 mg | SUBLINGUAL_TABLET | Freq: Once | SUBLINGUAL | Status: AC
  Administered 2018-12-22: 0.8 mg via SUBLINGUAL
  Filled 2018-12-22: qty 25

## 2018-12-22 MED ORDER — IOPAMIDOL (ISOVUE-370) INJECTION 76%
100.0000 mL | Freq: Once | INTRAVENOUS | Status: AC | PRN
  Administered 2018-12-22: 100 mL via INTRAVENOUS

## 2019-01-03 ENCOUNTER — Encounter: Payer: Self-pay | Admitting: Internal Medicine

## 2019-01-03 DIAGNOSIS — E039 Hypothyroidism, unspecified: Secondary | ICD-10-CM

## 2019-01-03 DIAGNOSIS — I10 Essential (primary) hypertension: Secondary | ICD-10-CM

## 2019-01-03 MED ORDER — CHLORTHALIDONE 25 MG PO TABS: 25.0000 mg | ORAL_TABLET | Freq: Every day | ORAL | 0 refills | Status: DC

## 2019-01-03 MED ORDER — LEVOTHYROXINE SODIUM 75 MCG PO TABS: 75.0000 ug | ORAL_TABLET | Freq: Every day | ORAL | 0 refills | Status: DC

## 2019-01-03 MED ORDER — IRBESARTAN 300 MG PO TABS: 300.0000 mg | ORAL_TABLET | Freq: Every day | ORAL | 1 refills | Status: DC

## 2019-01-03 MED ORDER — METOPROLOL TARTRATE 50 MG PO TABS: ORAL_TABLET | ORAL | 0 refills | Status: DC

## 2019-01-13 ENCOUNTER — Telehealth: Payer: Self-pay

## 2019-01-13 NOTE — Telephone Encounter (Signed)
Called patient about CT results. Per Dr. Johnsie Cancel, patient will need heart cath since [facility] was positive on CT results. Patient has not been seen in office in over 30 days, so made patient an appointment to see NP to discuss cath. Patient verbalized understanding and will come in on 01/16/19 to see Truitt Merle NP.

## 2019-01-16 ENCOUNTER — Encounter: Payer: Self-pay | Admitting: Nurse Practitioner

## 2019-01-16 ENCOUNTER — Ambulatory Visit (INDEPENDENT_AMBULATORY_CARE_PROVIDER_SITE_OTHER): Payer: 59 | Admitting: Nurse Practitioner

## 2019-01-16 VITALS — BP 150/90 | HR 81 | Ht 72.0 in | Wt 218.6 lb

## 2019-01-16 DIAGNOSIS — I25118 Atherosclerotic heart disease of native coronary artery with other forms of angina pectoris: Secondary | ICD-10-CM

## 2019-01-16 DIAGNOSIS — R079 Chest pain, unspecified: Secondary | ICD-10-CM

## 2019-01-16 MED ORDER — ROSUVASTATIN CALCIUM 5 MG PO TABS: 5.0000 mg | ORAL_TABLET | ORAL | 3 refills | Status: DC

## 2019-01-16 NOTE — Patient Instructions (Addendum)
We will be checking the following labs today - CMET and CBC  Medication Instructions:    Continue with your current medicines.   I sent in a RX for Crestor 5 mg to take only on Monday - Wednesday - Friday   If you need a refill on your cardiac medications before your next appointment, please call your pharmacy.     Testing/Procedures To Be Arranged:  Cardiac catheterization  Follow-Up:   See [place] in about 3 weeks    At [facility], you and your health needs are our priority.  As part of our continuing mission to provide you with exceptional heart care, we have created designated Provider Care Teams.  These Care Teams include your primary Cardiologist (physician) and Advanced Practice Providers (APPs -  Physician Assistants and Nurse Practitioners) who all work together to provide you with the care you need, when you need it.  Special Instructions:  Your provider has recommended a cardiac catherization  You are scheduled for a cardiac catheterization on Friday, February 7th at 10:30 AM with Dr. Christ Kick or associate.  Please arrive at the [facility] (Main Entrance) at [facility] at [address] [place] Stay on Friday, February 7th at 8:30AM.    Special note: Every effort is made to have your procedure done on time.   Please understand that emergencies sometimes delay a scheduled   procedure.  No solid foods after midnight on Thursday. You may have clear liquids until 5 am on the day of your procedure.  On the morning of your procedure, take your aspirin.   You may take your morning medications with a sip of water on the day of your procedure.  Please take a baby aspirin (81 mg) on the morning of your procedure.   Medications to [place] for a one night stay -- bring personal belongings.  Bring a current list of your medications and current insurance cards.  You MUST have a responsible person to drive you home. Someone MUST be  with you the first 24 hours after you arrive home or your discharge will be delayed. Wear clothes that are easy to get on and off and wear slip on shoes.    Coronary Angiogram A coronary angiogram, also called coronary angiography, is an X-ray procedure used to look at the arteries in the heart. In this procedure, a dye (contrast dye) is injected through a long, hollow tube (catheter). The catheter is about the size of a piece of cooked spaghetti and is inserted through your groin, wrist, or arm. The dye is injected into each artery, and X-rays are then taken to show if there is a blockage in the arteries of your heart.  LET [facility] CARE PROVIDER KNOW ABOUT: Any allergies you have, including allergies to shellfish or contrast dye.   All medicines you are taking, including vitamins, herbs, eye drops, creams, and over-the-counter medicines.   Previous problems you or members of your family have had with the use of anesthetics.   Any blood disorders you have.   Previous surgeries you have had. History of kidney problems or failure.   Other medical conditions you have.  RISKS AND COMPLICATIONS  Generally, a coronary angiogram is a safe procedure. However, about 1 person out of 1000 can have problems that may include: Allergic reaction to the dye. Bleeding/bruising from the access site or other locations. Kidney injury, especially in people with impaired kidney function.  Stroke (rare). Heart attack (rare). Irregular rhythms (rare) Death (rare)  BEFORE THE PROCEDURE  Do not eat or drink anything after midnight the night before the procedure or as directed by your health care provider.   Ask your health care provider about changing or stopping your regular medicines. This is especially important if you are taking diabetes medicines or blood thinners.  PROCEDURE You may be given a medicine to help you relax (sedative) before the procedure. This medicine is given through an intravenous (IV)  access tube that is inserted into one of your veins.   The area where the catheter will be inserted will be washed and shaved. This is usually done in the groin but may be done in the fold of your arm (near your elbow) or in the wrist.    A medicine will be given to numb the area where the catheter will be inserted (local anesthetic).   The health care provider will insert the catheter into an artery. The catheter will be guided by using a special type of X-ray (fluoroscopy) of the blood vessel being examined.   A special dye will then be injected into the catheter, and X-rays will be taken. The dye will help to show where any narrowing or blockages are located in the heart arteries.     AFTER THE PROCEDURE  If the procedure is done through the leg, you will be kept in bed lying flat for several hours. You will be instructed to not bend or cross your legs. The insertion site will be checked frequently.   The pulse in your feet or wrist will be checked frequently.   Additional blood tests, X-rays, and an electrocardiogram may be done.   .  .   Call the [place] office at [phone] if you have any questions, problems or concerns.

## 2019-01-16 NOTE — H&P (View-Only) (Signed)
CARDIOLOGY OFFICE NOTE  Date:  01/16/2019    Henry Owen Date of Birth: [birth year] Medical Record #[id number]  PCP:  Henry Lima, MD  Cardiologist:  Henry Owen    Chief Complaint  Patient presents with  . Chest Pain    Pre cath visit. Seen for Dr. Johnsie Owen    History of Present Illness: Henry Owen is a 58 y.o. male who presents today for a pre cath visit. Seen for Dr. Johnsie Owen. Saw Dr. Stanford Owen remotely in 201.   He has a history of HTN, tobacco abuse and prior abnormal stress echo with abnormal EKG but TTE images were normal.   Seen back in December by Dr. Johnsie Owen with atypical chest pain. Coronary CT obtained. See below. He was to start statin therapy.   Comes in today. Here alone. Admits he is more anxious with the recent news. BP is up. He did not start the statin - never picked it up.   Past Medical History:  Diagnosis Date  . BPH with obstruction/lower urinary tract symptoms   . Enlarged prostate   . Hypertension   . Hypothyroidism   . Tobacco use   . Warts, genital     Past Surgical History:  Procedure Laterality Date  . KNEE ARTHROSCOPY  2000  . LASIK  2005  . WISDOM TOOTH EXTRACTION       Medications: Current Meds  Medication Sig  . aspirin EC 81 MG tablet Take 1 tablet (81 mg total) by mouth daily.  . chlorthalidone (HYGROTON) 25 MG tablet Take 1 tablet (25 mg total) by mouth daily.  . irbesartan (AVAPRO) 300 MG tablet Take 1 tablet (300 mg total) by mouth daily.  Marland Kitchen levothyroxine (SYNTHROID, LEVOTHROID) 75 MCG tablet Take 1 tablet (75 mcg total) by mouth daily.     Allergies: No Known Allergies  Social History: The patient  reports that he quit smoking about 2 months ago. His smoking use included cigarettes. He has a 37.50 pack-year smoking history. He has never used smokeless tobacco. He reports that he does not drink alcohol or use drugs.   Family History: The patient's family history includes Diabetes in his mother; Early death in an  other family member; Heart disease in his father; Hypertension in his father.   Review of Systems: Please see the history of present illness.   Otherwise, the review of systems is positive for none.   All other systems are reviewed and negative.   Physical Exam: VS:  BP (!) 150/90 (BP Location: Left Arm, Patient Position: Sitting, Cuff Size: Normal)   Pulse 81   Ht 6' (1.829 m)   Wt 218 lb 9.6 oz (99.2 kg)   BMI 29.65 kg/m  .  BMI Body mass index is 29.65 kg/m.  Wt Readings from Last 3 Encounters:  01/16/19 218 lb 9.6 oz (99.2 kg)  11/24/18 216 lb (98 kg)  11/16/18 209 lb (94.8 kg)    General: Pleasant. Well developed, well nourished and in no acute distress.   HEENT: Normal.  Neck: Supple, no JVD, carotid bruits, or masses noted.  Cardiac: Regular rate and rhythm. No murmurs, rubs, or gallops. No edema.  Respiratory:  Lungs are clear to auscultation bilaterally with normal work of breathing.  GI: Soft and nontender.  MS: No deformity or atrophy. Gait and ROM intact.  Skin: Warm and dry. Color is normal.  Neuro:  Strength and sensation are intact and no gross focal deficits noted.  Psych: Alert, appropriate and with  normal affect.   LABORATORY DATA:  EKG:  EKG is ordered today. This demonstrates NSR.  Lab Results  Component Value Date   WBC 11.4 10/31/2018   HGB 16.6 10/31/2018   HCT 47 10/31/2018   PLT 235 10/31/2018   GLUCOSE 89 04/21/2018   CHOL 155 10/31/2018   TRIG 141 10/31/2018   HDL 47 10/31/2018   LDLCALC 81 10/31/2018   ALT 18 04/21/2018   AST 21 04/21/2018   NA 133 (A) 10/31/2018   K 4.3 10/31/2018   CL 106 04/21/2018   CREATININE 0.9 10/31/2018   BUN 14 10/31/2018   CO2 30 04/21/2018   TSH 2.45 10/31/2018   PSA 0.3 10/31/2018   HGBA1C 5.1 04/21/2018     BNP (last 3 results) No results for input(s): BNP in the last 8760 hours.  ProBNP (last 3 results) No results for input(s): PROBNP in the last 8760 hours.   Other Studies Reviewed  Today:  CORONARY CT FINDINGS 12/22/2018: Non-cardiac: See separate report from [facility] Radiology. No significant findings on limited lung and soft tissue windows.  Calcium Score: 3 vessel coronary calcium noted  Coronary Arteries: Left  dominant with no anomalies  LM: Normal  LAD: 50% or less multiple mixed plaque in mid vessel.  IM: 50% multiple calcific plaque  D1: 50% multiple calcific plaque  D2: Normal  Circumflex: 50% calcific plaque in mid vessel  OM1: 50% calcific plaque  OM2: Normal  PDA: Normal  PLB: Normal  RCA: Less than 30% calcific plaque proximally  IMPRESSION: 1. Calcium score 304 which is 77 th percentile for age and sex  2. Left dominant coronary arteries with moderate appearing stenosis in mid LAD/Circumflex as well as IM/D1 and OM1 study to be sent for FFR CT  3.  Normal aortic root 3.5 cm  Henry Owen IMPRESSION: FFR CT positive in mid LAD and OM1 and OM2 patient will be referred for heart catheterization mid circumflex is borderline  Henry Owen  Assessment/Plan:  1.  Atypical chest pain - multiple CV risk factors - now with abnormal coronary CT - cardiac catheterization has been recommended - The patient understands that risks include but are not limited to stroke (1 in 1000), death (1 in 1000), kidney failure [usually temporary] (1 in 500), bleeding (1 in 200), allergic reaction [possibly serious] (1 in 200), and agrees to proceed. Arranged for this Friday with Dr. Irish Owen. His contact person on his chart is who will be coming with him Friday.   2. HTN - BP up some here today - he admits he is anxious - would follow for now.   3. Tobacco abuse - he has stopped smoking. He is Advertising account executive.   4. HLD - reports issues with statins - does not remember what he has tried in the past. He was going to try the Livalo - however not preferred on his insurance. He will try Crestor 5 mg Monday-Wednesday-Friday. If he has  issues - may need to refer to the lipid clinic.   Current medicines are reviewed with the patient today.  The patient does not have concerns regarding medicines other than what has been noted above.  The following changes have been made:  See above.  Labs/ tests ordered today include:    Orders Placed This Encounter  Procedures  . Comprehensive metabolic panel  . CBC  . EKG 12-Lead     Disposition:   FU with [place] in about 3 weeks post cath.    Patient is  agreeable to this plan and will call if any problems develop in the interim.   SignedTruitt Merle, NP  01/16/2019 2:25 PM  [facility] [address] [place], [place]  [address] Phone: [phone] Fax: [phone]

## 2019-01-16 NOTE — Progress Notes (Signed)
CARDIOLOGY OFFICE NOTE  Date:  01/16/2019    Henry Owen Date of Birth: 11-13-1961 Medical Record #[id number]  PCP:  Janith Lima, MD  Cardiologist:  Johnsie Cancel    Chief Complaint  Patient presents with  . Chest Pain    Pre cath visit. Seen for Dr. Johnsie Cancel    History of Present Illness: Henry Owen is a 58 y.o. male who presents today for a pre cath visit. Seen for Dr. Johnsie Cancel. Saw Dr. Stanford Breed remotely in 201.   He has a history of HTN, tobacco abuse and prior abnormal stress echo with abnormal EKG but TTE images were normal.   Seen back in December by Dr. Johnsie Cancel with atypical chest pain. Coronary CT obtained. See below. He was to start statin therapy.   Comes in today. Here alone. Admits he is more anxious with the recent news. BP is up. He did not start the statin - never picked it up.   Past Medical History:  Diagnosis Date  . BPH with obstruction/lower urinary tract symptoms   . Enlarged prostate   . Hypertension   . Hypothyroidism   . Tobacco use   . Warts, genital     Past Surgical History:  Procedure Laterality Date  . KNEE ARTHROSCOPY  2000  . LASIK  2005  . WISDOM TOOTH EXTRACTION       Medications: Current Meds  Medication Sig  . aspirin EC 81 MG tablet Take 1 tablet (81 mg total) by mouth daily.  . chlorthalidone (HYGROTON) 25 MG tablet Take 1 tablet (25 mg total) by mouth daily.  . irbesartan (AVAPRO) 300 MG tablet Take 1 tablet (300 mg total) by mouth daily.  Marland Kitchen levothyroxine (SYNTHROID, LEVOTHROID) 75 MCG tablet Take 1 tablet (75 mcg total) by mouth daily.     Allergies: No Known Allergies  Social History: The patient  reports that he quit smoking about 2 months ago. His smoking use included cigarettes. He has a 37.50 pack-year smoking history. He has never used smokeless tobacco. He reports that he does not drink alcohol or use drugs.   Family History: The patient's family history includes Diabetes in his mother; Early death in an  other family member; Heart disease in his father; Hypertension in his father.   Review of Systems: Please see the history of present illness.   Otherwise, the review of systems is positive for none.   All other systems are reviewed and negative.   Physical Exam: VS:  BP (!) 150/90 (BP Location: Left Arm, Patient Position: Sitting, Cuff Size: Normal)   Pulse 81   Ht 6' (1.829 m)   Wt 218 lb 9.6 oz (99.2 kg)   BMI 29.65 kg/m  .  BMI Body mass index is 29.65 kg/m.  Wt Readings from Last 3 Encounters:  01/16/19 218 lb 9.6 oz (99.2 kg)  11/24/18 216 lb (98 kg)  11/16/18 209 lb (94.8 kg)    General: Pleasant. Well developed, well nourished and in no acute distress.   HEENT: Normal.  Neck: Supple, no JVD, carotid bruits, or masses noted.  Cardiac: Regular rate and rhythm. No murmurs, rubs, or gallops. No edema.  Respiratory:  Lungs are clear to auscultation bilaterally with normal work of breathing.  GI: Soft and nontender.  MS: No deformity or atrophy. Gait and ROM intact.  Skin: Warm and dry. Color is normal.  Neuro:  Strength and sensation are intact and no gross focal deficits noted.  Psych: Alert, appropriate and with  normal affect.   LABORATORY DATA:  EKG:  EKG is ordered today. This demonstrates NSR.  Lab Results  Component Value Date   WBC 11.4 10/31/2018   HGB 16.6 10/31/2018   HCT 47 10/31/2018   PLT 235 10/31/2018   GLUCOSE 89 04/21/2018   CHOL 155 10/31/2018   TRIG 141 10/31/2018   HDL 47 10/31/2018   LDLCALC 81 10/31/2018   ALT 18 04/21/2018   AST 21 04/21/2018   NA 133 (A) 10/31/2018   K 4.3 10/31/2018   CL 106 04/21/2018   CREATININE 0.9 10/31/2018   BUN 14 10/31/2018   CO2 30 04/21/2018   TSH 2.45 10/31/2018   PSA 0.3 10/31/2018   HGBA1C 5.1 04/21/2018     BNP (last 3 results) No results for input(s): BNP in the last 8760 hours.  ProBNP (last 3 results) No results for input(s): PROBNP in the last 8760 hours.   Other Studies Reviewed  Today:  CORONARY CT FINDINGS 12/22/2018: Non-cardiac: See separate report from [facility]. No significant findings on limited lung and soft tissue windows.  Calcium Score: 3 vessel coronary calcium noted  Coronary Arteries: Left  dominant with no anomalies  LM: Normal  LAD: 50% or less multiple mixed plaque in mid vessel.  IM: 50% multiple calcific plaque  D1: 50% multiple calcific plaque  D2: Normal  Circumflex: 50% calcific plaque in mid vessel  OM1: 50% calcific plaque  OM2: Normal  PDA: Normal  PLB: Normal  RCA: Less than 30% calcific plaque proximally  IMPRESSION: 1. Calcium score 304 which is 39 th percentile for age and sex  2. Left dominant coronary arteries with moderate appearing stenosis in mid LAD/Circumflex as well as IM/D1 and OM1 study to be sent for FFR CT  3.  Normal aortic root 3.5 cm  Jenkins Rouge IMPRESSION: FFR CT positive in mid LAD and OM1 and OM2 patient will be referred for heart catheterization mid circumflex is borderline  Jenkins Rouge  Assessment/Plan:  1.  Atypical chest pain - multiple CV risk factors - now with abnormal coronary CT - cardiac catheterization has been recommended - The patient understands that risks include but are not limited to stroke (1 in 1000), death (1 in 1000), kidney failure [usually temporary] (1 in 500), bleeding (1 in 200), allergic reaction [possibly serious] (1 in 200), and agrees to proceed. Arranged for this Friday with Dr. Irish Lack. His contact person on his chart is who will be coming with him Friday.   2. HTN - BP up some here today - he admits he is anxious - would follow for now.   3. Tobacco abuse - he has stopped smoking. He is Advertising account executive.   4. HLD - reports issues with statins - does not remember what he has tried in the past. He was going to try the Livalo - however not preferred on his insurance. He will try Crestor 5 mg Monday-Wednesday-Friday. If he has  issues - may need to refer to the lipid clinic.   Current medicines are reviewed with the patient today.  The patient does not have concerns regarding medicines other than what has been noted above.  The following changes have been made:  See above.  Labs/ tests ordered today include:    Orders Placed This Encounter  Procedures  . Comprehensive metabolic panel  . CBC  . EKG 12-Lead     Disposition:   FU with [place] in about 3 weeks post cath.    Patient is  agreeable to this plan and will call if any problems develop in the interim.   SignedTruitt Merle, NP  01/16/2019 2:25 PM  [facility] [address] [place], [place]  [address] Phone: [phone] Fax: [phone]

## 2019-01-17 LAB — COMPREHENSIVE METABOLIC PANEL
ALT: 25 IU/L (ref 0–44)
AST: 25 IU/L (ref 0–40)
Albumin/Globulin Ratio: 1.4 (ref 1.2–2.2)
Albumin: 4.5 g/dL (ref 3.8–4.9)
Alkaline Phosphatase: 69 IU/L (ref 39–117)
BUN/Creatinine Ratio: 13 (ref 9–20)
BUN: 13 mg/dL (ref 6–24)
Bilirubin Total: 0.5 mg/dL (ref 0.0–1.2)
CO2: 23 mmol/L (ref 20–29)
Calcium: 9.8 mg/dL (ref 8.7–10.2)
Chloride: 99 mmol/L (ref 96–106)
Creatinine, Ser: 0.98 mg/dL (ref 0.76–1.27)
GFR calc Af Amer: 99 mL/min/{1.73_m2} (ref >59)
GFR calc non Af Amer: 85 mL/min/{1.73_m2} (ref >59)
Globulin, Total: 3.3 g/dL (ref 1.5–4.5)
Glucose: 95 mg/dL (ref 65–99)
Potassium: 3.5 mmol/L (ref 3.5–5.2)
Sodium: 140 mmol/L (ref 134–144)
Total Protein: 7.8 g/dL (ref 6.0–8.5)

## 2019-01-17 LAB — CBC
Hematocrit: 45.8 % (ref 37.5–51.0)
Hemoglobin: 16.1 g/dL (ref 13.0–17.7)
MCH: 32.3 pg (ref 26.6–33.0)
MCHC: 35.2 g/dL (ref 31.5–35.7)
MCV: 92 fL (ref 79–97)
Platelets: 185 10*3/uL (ref 150–450)
RBC: 4.99 x10E6/uL (ref 4.14–5.80)
RDW: 12.6 % (ref 11.6–15.4)
WBC: 8.6 10*3/uL (ref 3.4–10.8)

## 2019-01-19 ENCOUNTER — Telehealth: Payer: Self-pay | Admitting: *Deleted

## 2019-01-19 NOTE — Telephone Encounter (Signed)
Pt contacted pre-catheterization scheduled at [facility] for: Friday January 20, 2019 10:30 AM Verified arrival time and place: [place] Entrance A at: 8 AM  No solid food after midnight prior to cath, clear liquids until 5 AM day of procedure. Contrast allergy: no  Hold: Chlorthalidone -AM of procedure.  Except hold medications AM meds can be  taken pre-cath with sip of water including: ASA 81 mg  Confirmed patient has responsible person to drive home post procedure and observe 24 hours after arriving home: yes

## 2019-01-20 ENCOUNTER — Ambulatory Visit (HOSPITAL_COMMUNITY)
Admission: RE | Admit: 2019-01-20 | Discharge: 2019-01-20 | Disposition: A | Discharge status: Home / Self Care | Payer: 59 | Attending: Interventional Cardiology | Admitting: Interventional Cardiology

## 2019-01-20 ENCOUNTER — Encounter (HOSPITAL_COMMUNITY): Payer: Self-pay | Admitting: Physician Assistant

## 2019-01-20 ENCOUNTER — Encounter (HOSPITAL_COMMUNITY)
Admission: RE | Disposition: A | Discharge status: Home / Self Care | Payer: Self-pay | Source: Home / Self Care | Attending: Interventional Cardiology

## 2019-01-20 DIAGNOSIS — Z79890 Hormone replacement therapy (postmenopausal): Secondary | ICD-10-CM | POA: Diagnosis not present

## 2019-01-20 DIAGNOSIS — I10 Essential (primary) hypertension: Secondary | ICD-10-CM | POA: Diagnosis present

## 2019-01-20 DIAGNOSIS — Z8249 Family history of ischemic heart disease and other diseases of the circulatory system: Secondary | ICD-10-CM | POA: Diagnosis not present

## 2019-01-20 DIAGNOSIS — Z87891 Personal history of nicotine dependence: Secondary | ICD-10-CM | POA: Insufficient documentation

## 2019-01-20 DIAGNOSIS — E785 Hyperlipidemia, unspecified: Secondary | ICD-10-CM | POA: Diagnosis present

## 2019-01-20 DIAGNOSIS — R931 Abnormal findings on diagnostic imaging of heart and coronary circulation: Secondary | ICD-10-CM | POA: Diagnosis not present

## 2019-01-20 DIAGNOSIS — I2510 Atherosclerotic heart disease of native coronary artery without angina pectoris: Secondary | ICD-10-CM

## 2019-01-20 DIAGNOSIS — N401 Enlarged prostate with lower urinary tract symptoms: Secondary | ICD-10-CM | POA: Diagnosis not present

## 2019-01-20 DIAGNOSIS — E039 Hypothyroidism, unspecified: Secondary | ICD-10-CM | POA: Insufficient documentation

## 2019-01-20 DIAGNOSIS — I25119 Atherosclerotic heart disease of native coronary artery with unspecified angina pectoris: Secondary | ICD-10-CM | POA: Insufficient documentation

## 2019-01-20 DIAGNOSIS — R079 Chest pain, unspecified: Secondary | ICD-10-CM

## 2019-01-20 DIAGNOSIS — I25118 Atherosclerotic heart disease of native coronary artery with other forms of angina pectoris: Secondary | ICD-10-CM

## 2019-01-20 DIAGNOSIS — Z955 Presence of coronary angioplasty implant and graft: Secondary | ICD-10-CM

## 2019-01-20 DIAGNOSIS — N138 Other obstructive and reflux uropathy: Secondary | ICD-10-CM | POA: Insufficient documentation

## 2019-01-20 DIAGNOSIS — Z7982 Long term (current) use of aspirin: Secondary | ICD-10-CM | POA: Diagnosis not present

## 2019-01-20 DIAGNOSIS — I209 Angina pectoris, unspecified: Secondary | ICD-10-CM | POA: Diagnosis present

## 2019-01-20 DIAGNOSIS — Z79899 Other long term (current) drug therapy: Secondary | ICD-10-CM | POA: Insufficient documentation

## 2019-01-20 HISTORY — DX: Atherosclerotic heart disease of native coronary artery without angina pectoris: I25.10

## 2019-01-20 HISTORY — PX: CORONARY STENT INTERVENTION: CATH118234

## 2019-01-20 HISTORY — PX: INTRAVASCULAR PRESSURE WIRE/FFR STUDY: CATH118243

## 2019-01-20 HISTORY — PX: LEFT HEART CATH AND CORONARY ANGIOGRAPHY: CATH118249

## 2019-01-20 LAB — POCT ACTIVATED CLOTTING TIME
Activated Clotting Time: 318 seconds
Activated Clotting Time: 279 seconds
Activated Clotting Time: 285 seconds

## 2019-01-20 SURGERY — LEFT HEART CATH AND CORONARY ANGIOGRAPHY
Anesthesia: LOCAL

## 2019-01-20 MED ORDER — VERAPAMIL HCL 2.5 MG/ML IV SOLN
INTRAVENOUS | Status: DC | PRN
  Administered 2019-01-20: 10 mL via INTRA_ARTERIAL

## 2019-01-20 MED ORDER — SODIUM CHLORIDE 0.9% FLUSH: 3.0000 mL | INTRAVENOUS | Status: DC | PRN

## 2019-01-20 MED ORDER — MIDAZOLAM HCL 2 MG/2ML IJ SOLN
INTRAMUSCULAR | Status: AC
  Filled 2019-01-20: qty 2

## 2019-01-20 MED ORDER — FENTANYL CITRATE (PF) 100 MCG/2ML IJ SOLN
INTRAMUSCULAR | Status: DC | PRN
  Administered 2019-01-20: 50 ug via INTRAVENOUS
  Administered 2019-01-20: 25 ug via INTRAVENOUS

## 2019-01-20 MED ORDER — HEPARIN (PORCINE) IN NACL 1000-0.9 UT/500ML-% IV SOLN
INTRAVENOUS | Status: AC
  Filled 2019-01-20: qty 500

## 2019-01-20 MED ORDER — CLOPIDOGREL BISULFATE 300 MG PO TABS
ORAL_TABLET | ORAL | Status: AC
  Filled 2019-01-20: qty 2

## 2019-01-20 MED ORDER — HEPARIN SODIUM (PORCINE) 1000 UNIT/ML IJ SOLN
INTRAMUSCULAR | Status: AC
  Filled 2019-01-20: qty 1

## 2019-01-20 MED ORDER — LABETALOL HCL 5 MG/ML IV SOLN: 10.0000 mg | INTRAVENOUS | Status: DC | PRN

## 2019-01-20 MED ORDER — NITROGLYCERIN 1 MG/10 ML FOR IR/CATH LAB
INTRA_ARTERIAL | Status: DC | PRN
  Administered 2019-01-20 (×4): 200 ug via INTRACORONARY

## 2019-01-20 MED ORDER — HEPARIN (PORCINE) IN NACL 1000-0.9 UT/500ML-% IV SOLN
INTRAVENOUS | Status: DC | PRN
  Administered 2019-01-20 (×2): 500 mL

## 2019-01-20 MED ORDER — LIDOCAINE HCL (PF) 1 % IJ SOLN
INTRAMUSCULAR | Status: AC
  Filled 2019-01-20: qty 30

## 2019-01-20 MED ORDER — ADENOSINE 12 MG/4ML IV SOLN
INTRAVENOUS | Status: AC
  Filled 2019-01-20: qty 16

## 2019-01-20 MED ORDER — CLOPIDOGREL BISULFATE 75 MG PO TABS: 75.0000 mg | ORAL_TABLET | Freq: Every day | ORAL | 11 refills | Status: DC

## 2019-01-20 MED ORDER — ASPIRIN 81 MG PO CHEW: 81.0000 mg | CHEWABLE_TABLET | ORAL | Status: DC

## 2019-01-20 MED ORDER — IOHEXOL 350 MG/ML SOLN
INTRAVENOUS | Status: DC | PRN
  Administered 2019-01-20: 220 mL

## 2019-01-20 MED ORDER — LIDOCAINE HCL (PF) 1 % IJ SOLN
INTRAMUSCULAR | Status: DC | PRN
  Administered 2019-01-20: 2 mL

## 2019-01-20 MED ORDER — HYDRALAZINE HCL 20 MG/ML IJ SOLN: 5.0000 mg | INTRAMUSCULAR | Status: DC | PRN

## 2019-01-20 MED ORDER — MIDAZOLAM HCL 2 MG/2ML IJ SOLN
INTRAMUSCULAR | Status: DC | PRN
  Administered 2019-01-20: 2 mg via INTRAVENOUS

## 2019-01-20 MED ORDER — ACETAMINOPHEN 325 MG PO TABS: 650.0000 mg | ORAL_TABLET | ORAL | Status: DC | PRN

## 2019-01-20 MED ORDER — VERAPAMIL HCL 2.5 MG/ML IV SOLN
INTRAVENOUS | Status: AC
  Filled 2019-01-20: qty 2

## 2019-01-20 MED ORDER — CLOPIDOGREL BISULFATE 300 MG PO TABS
ORAL_TABLET | ORAL | Status: DC | PRN
  Administered 2019-01-20: 600 mg via ORAL

## 2019-01-20 MED ORDER — ONDANSETRON HCL 4 MG/2ML IJ SOLN: 4.0000 mg | Freq: Four times a day (QID) | INTRAMUSCULAR | Status: DC | PRN

## 2019-01-20 MED ORDER — HEPARIN SODIUM (PORCINE) 1000 UNIT/ML IJ SOLN
INTRAMUSCULAR | Status: DC | PRN
  Administered 2019-01-20: 3000 [IU] via INTRAVENOUS
  Administered 2019-01-20 (×2): 5000 [IU] via INTRAVENOUS

## 2019-01-20 MED ORDER — SODIUM CHLORIDE 0.9% FLUSH: 3.0000 mL | Freq: Two times a day (BID) | INTRAVENOUS | Status: DC

## 2019-01-20 MED ORDER — SODIUM CHLORIDE 0.9 % IV SOLN: INTRAVENOUS | Status: DC

## 2019-01-20 MED ORDER — DIAZEPAM 5 MG PO TABS
10.0000 mg | ORAL_TABLET | ORAL | Status: AC
  Administered 2019-01-20: 10 mg via ORAL
  Filled 2019-01-20: qty 2

## 2019-01-20 MED ORDER — CLOPIDOGREL BISULFATE 75 MG PO TABS: 75.0000 mg | ORAL_TABLET | Freq: Every day | ORAL | Status: DC

## 2019-01-20 MED ORDER — NITROGLYCERIN 0.4 MG SL SUBL: 0.4000 mg | SUBLINGUAL_TABLET | SUBLINGUAL | 12 refills | Status: DC | PRN

## 2019-01-20 MED ORDER — NITROGLYCERIN 1 MG/10 ML FOR IR/CATH LAB
INTRA_ARTERIAL | Status: AC
  Filled 2019-01-20: qty 10

## 2019-01-20 MED ORDER — SODIUM CHLORIDE 0.9 % IV SOLN
INTRAVENOUS | Status: DC
  Administered 2019-01-20: 08:00:00 via INTRAVENOUS

## 2019-01-20 MED ORDER — ADENOSINE (DIAGNOSTIC) 140MCG/KG/MIN
INTRAVENOUS | Status: DC | PRN
  Administered 2019-01-20: 140 ug/kg/min via INTRAVENOUS

## 2019-01-20 MED ORDER — FENTANYL CITRATE (PF) 100 MCG/2ML IJ SOLN
INTRAMUSCULAR | Status: AC
  Filled 2019-01-20: qty 2

## 2019-01-20 MED ORDER — SODIUM CHLORIDE 0.9 % IV SOLN: 250.0000 mL | INTRAVENOUS | Status: DC | PRN

## 2019-01-20 MED ORDER — ASPIRIN 81 MG PO CHEW: 81.0000 mg | CHEWABLE_TABLET | Freq: Every day | ORAL | Status: DC

## 2019-01-20 MED FILL — CLOPIDOGREL 75 MG TABLET: 75 | 30 days supply | Qty: 30 | Fill #0 | Status: TO

## 2019-01-20 MED FILL — NITROGLYCERIN 0.4 MG TAB SL: 0.4 | 8 days supply | Qty: 25 | Fill #0 | Status: TO

## 2019-01-20 SURGICAL SUPPLY — 21 items
BALLN SAPPHIRE 2.0X15 (BALLOONS) ×2
BALLN SAPPHIRE ~~LOC~~ 2.5X18 (BALLOONS) ×1 IMPLANT
BALLN SAPPHIRE ~~LOC~~ 2.75X12 (BALLOONS) ×1 IMPLANT
BALLOON SAPPHIRE 2.0X15 (BALLOONS) IMPLANT
CATH OPTITORQUE TIG 4.0 5F (CATHETERS) ×1 IMPLANT
CATH VISTA GUIDE 6FR XBLAD3.5 (CATHETERS) ×1 IMPLANT
DEVICE RAD COMP TR BAND LRG (VASCULAR PRODUCTS) ×1 IMPLANT
GLIDESHEATH SLEND A-KIT 6F 22G (SHEATH) ×1 IMPLANT
GUIDEWIRE INQWIRE 1.5J.035X260 (WIRE) IMPLANT
GUIDEWIRE PRESSURE COMET II (WIRE) ×1 IMPLANT
INQWIRE 1.5J .035X260CM (WIRE) ×2
KIT ENCORE 26 ADVANTAGE (KITS) ×1 IMPLANT
KIT ESSENTIALS PG (KITS) ×1 IMPLANT
KIT HEART LEFT (KITS) ×2 IMPLANT
PACK CARDIAC CATHETERIZATION (CUSTOM PROCEDURE TRAY) ×2 IMPLANT
SHEATH PROBE COVER 6X72 (BAG) ×1 IMPLANT
STENT RESOLUTE ONYX 2.25X26 (Permanent Stent) ×1 IMPLANT
STENT RESOLUTE ONYX 2.5X18 (Permanent Stent) ×1 IMPLANT
TRANSDUCER W/STOPCOCK (MISCELLANEOUS) ×2 IMPLANT
TUBING CIL FLEX 10 FLL-RA (TUBING) ×2 IMPLANT
WIRE SION BLUE 180 (WIRE) ×1 IMPLANT

## 2019-01-20 NOTE — Interval H&P Note (Signed)
History and Physical Interval Note:  01/20/2019 10:49 AM  Henry Owen  has presented today for surgery, with the diagnosis of abnormal CTA.  The various methods of treatment have been discussed with the patient and family. After consideration of risks, benefits and other options for treatment, the patient has consented to  Procedure(s): LEFT HEART CATH AND CORONARY ANGIOGRAPHY (N/A)  With possible PERCUTANEOUS CORONARY INTERVENTION s a surgical intervention .  The patient's history has been reviewed, patient examined, no change in status, stable for surgery.  I have reviewed the patient's chart and labs.  Questions were answered to the patient's satisfaction.    Cath Lab Visit (complete for each Cath Lab visit)  Clinical Evaluation Leading to the Procedure:   ACS: No.  Non-ACS:    Anginal Classification: CCS II  Anti-ischemic medical therapy: Minimal Therapy (1 class of medications)  Non-Invasive Test Results: High-risk stress test findings: cardiac mortality >3%/year  Prior CABG: No previous CABG   Glenetta Hew

## 2019-01-20 NOTE — Discharge Instructions (Signed)
No driving for 48 hours. No lifting over 5 lbs for 1 week. No sexual activity for 1 week.   Drink plenty of fluids over next 48 hours and keep right wrist elevated at heart level for 24 hours  Radial Site Care  This sheet gives you information about how to care for yourself after your procedure. Your health care provider may also give you more specific instructions. If you have problems or questions, contact your health care provider. What can I expect after the procedure? After the procedure, it is common to have:  Bruising and tenderness at the catheter insertion area. Follow these instructions at home: Medicines  Take over-the-counter and prescription medicines only as told by your health care provider. Insertion site care  Follow instructions from your health care provider about how to take care of your insertion site. Make sure you: ? Wash your hands with soap and water before you change your bandage (dressing). If soap and water are not available, use hand sanitizer. ? Remove your dressing as told by your health care provider. In 24-48 hours  Check your insertion site every day for signs of infection. Check for: ? Redness, swelling, or pain. ? Fluid or blood. ? Pus or a bad smell. ? Warmth.  Do not take baths, swim, or use a hot tub until your health care provider approves.  You may shower 24-48 hours after the procedure, or as directed by your health care provider. ? Remove the dressing and gently wash the site with plain soap and water. ? Pat the area dry with a clean towel. ? Do not rub the site. That could cause bleeding.  Do not apply powder or lotion to the site. Activity   For 24 hours after the procedure, or as directed by your health care provider: ? Do not flex or bend the affected arm. ? Do not push or pull heavy objects with the affected arm. ? Do not drive yourself home from the hospital or clinic. You may drive 24 hours after the procedure unless your  health care provider tells you not to. ? Do not operate machinery or power tools.  Do not lift anything that is heavier than 5lb, or the limit that you are told, until your health care provider says that it is safe. For 5-7 days  Ask your health care provider when it is okay to: ? Return to work or school. ? Resume usual physical activities or sports. ? Resume sexual activity. General instructions  If the catheter site starts to bleed, raise your arm and put firm pressure on the site. If the bleeding does not stop, get help right away. This is a medical emergency.  If you went home on the same day as your procedure, a responsible adult should be with you for the first 24 hours after you arrive home.  Keep all follow-up visits as told by your health care provider. This is important. Contact a health care provider if:  You have a fever.  You have redness, swelling, or yellow drainage around your insertion site. Get help right away if:  You have unusual pain at the radial site.  The catheter insertion area swells very fast.  The insertion area is bleeding, and the bleeding does not stop when you hold steady pressure on the area.  Your arm or hand becomes pale, cool, tingly, or numb. These symptoms may represent a serious problem that is an emergency. Do not wait to see if the symptoms will  go away. Get medical help right away. Call your local emergency services (911 in the [place] Do not drive yourself to the hospital. Summary  After the procedure, it is common to have bruising and tenderness at the site.  Follow instructions from your health care provider about how to take care of your radial site wound. Check the wound every day for signs of infection.  Do not lift anything that is heavier than 10 lb (4.5 kg), or the limit that you are told, until your health care provider says that it is safe. This information is not intended to replace advice given to you by your health care  provider. Make sure you discuss any questions you have with your health care provider. Document Released: 01/02/2011 Document Revised: 01/05/2018 Document Reviewed: 01/05/2018 Elsevier Interactive Patient Education  2019 [employer].

## 2019-01-20 NOTE — Discharge Summary (Signed)
Discharge Summary    Patient ID: Henry Owen MRN: [id number]; DOB: 03-18-1961  Admit date: 01/20/2019 Discharge date: 01/20/2019  Primary Care Provider: Janith Lima, MD  Primary Cardiologist: Jenkins Rouge, MD  Discharge Diagnoses    Principal Problem:   Abnormal cardiac CT angiography Active Problems:   Essential hypertension, benign   Hyperlipidemia LDL goal <70   Allergies No Known Allergies  Diagnostic Studies/Procedures    CORONARY STENT INTERVENTION  INTRAVASCULAR PRESSURE WIRE/FFR STUDY  LEFT HEART CATH AND CORONARY ANGIOGRAPHY  Conclusion     Lesion #1: Prox LAD lesion is 70% stenosed. -FFR positive at 0.77  A drug-eluting stent was successfully placed using a STENT RESOLUTE ONYX 2.5X18. Taper post dilation from 2.8 down to 2.6 mm  Post intervention, there is a 0% residual stenosis.  Mid LAD lesion is 45% stenosed. -Downstream from stent  Ost 1st Diag to 1st Diag lesion is 85% stenosed. Vessel too small for PCI  ---------------------  Ost 2nd Mrg-1 lesion is 50% stenosed. Not involved with stent  Lesion #2 :2nd Mrg lesion is 70% stenosed & 95% stenosed.  A drug-eluting stent was successfully placed covering both lesions using a STENT RESOLUTE ONYX 2.25X26. Taper post dilation from 2.6-2.4 mm  ---------------------  Post intervention, there is a 0% residual stenosis.  The left ventricular systolic function is normal. EF is 55-65% by visual estimate. LV end diastolic pressure is normal.  A drug-eluting stent was successfully placed using a STENT RESOLUTE ONYX 2.5X18.  A drug-eluting stent was successfully placed using a STENT RESOLUTE ONYX 2.25X26.  Post intervention, there is a 0% residual stenosis.   SUMMARY  Severe three-vessel disease with tandem 70 to 95% lesions in OM 2 (no lesions seen in OM1 despite CT FFR positive), 85% 1st diagonal (~1.5-2 mm vessel.  Not good PCI target), and 70% proximal LAD (FFR 0.77)  Successful two-vessel PCI:  Proximal LAD resolute Onyx DES 2.5 mm x 18 mm postdilated to 2.8-2.6 mm; OM 2 PCI with resolute Onyx DES 2.25 mm x 26 mm postdilated to 2.6-2.4 mm).  Normal LV function with normal LVEDP  Left dominant system with very small nondominant RCA.   Diagnostic  Dominance: Left    Intervention       History of Present Illness     Henry Owen is a 58 y.o. male history of HTN, HLD and tobacco abuse presents for outpatient cath.    Prior abnormal stress echo with abnormal EKG but TTE images were normal. Seen by Dr. Johnsie Cancel 11/2018 for chest pain. Coronary CT with calcium score of 304. FFR CT positive in mid LAD and OM1 and OM2. Recommended cath.   Hospital Course     Consultants: None  Cath showed severe three-vessel disease with tandem 70 to 95% lesions in OM 2 (no lesions seen in OM1 despite CT FFR positive), 85% 1st diagonal (~1.5-2 mm vessel.  Not good PCI target), and 70% proximal LAD (FFR 0.77). S/o successful two-vessel PCI: Proximal LAD resolute Onyx DES 2.5 mm x 18 mm postdilated to 2.8-2.6 mm; OM 2 PCI with resolute Onyx DES 2.25 mm x 26 mm postdilated to 2.6-2.4 mm). Normal LV function with normal LVEDP. No complications. Felt stable for same day discharge.  Hx of statin intolerance. Recently started low dose crestor 3 times/week.   Discharge Vitals Blood pressure 139/83, pulse 79, temperature 98 F (36.7 C), temperature source Oral, resp. rate 20, height 6' (1.829 m), weight 97.5 kg, SpO2 98 %.  Filed Weights  01/20/19 0746  Weight: 97.5 kg   Physical Exam  Constitutional: He is oriented to person, place, and time and well-developed, well-nourished, and in no distress.  HENT:  Head: Normocephalic and atraumatic.  Eyes: Pupils are equal, round, and reactive to light. EOM are normal.  Neck: Normal range of motion. Neck supple.  Cardiovascular: Normal rate and regular rhythm.  R radial cath site stable   Pulmonary/Chest: Effort normal and breath sounds normal.  Abdominal:  Soft. Bowel sounds are normal.  Musculoskeletal: Normal range of motion.  Neurological: He is alert and oriented to person, place, and time.  Skin: Skin is warm and dry.  Psychiatric: Affect normal.    Labs & Radiologic Studies    _____________  Ct Coronary Morph W/cta Cor W/score W/ca W/cm &/or Wo/cm  Addendum Date: 12/22/2018   ADDENDUM REPORT: 12/22/2018 13:57 EXAM: OVER-READ INTERPRETATION  CT CHEST The following report is an over-read performed by radiologist Dr. Collene Leyden [facility] on 12/22/2018. This over-read does not include interpretation of cardiac or coronary anatomy or pathology. The coronary CTA interpretation by the cardiologist is attached. COMPARISON:  None. FINDINGS: Heart is normal size. Visualized aorta is normal caliber. No adenopathy in the lower mediastinum or hila. Small hiatal hernia. Dependent atelectasis in the lower lobes. No confluent opacities or effusions. Imaging into the upper abdomen shows no acute findings. Chest wall soft tissues are unremarkable. No acute bony abnormality. IMPRESSION: No acute or significant extracardiac abnormality. Electronically Signed   By: Rolm Baptise M.D.   On: 12/22/2018 13:57   Result Date: 12/22/2018 CLINICAL DATA:  Chest pain EXAM: Cardiac CTA MEDICATIONS: Sub lingual nitro. 4mg  and lopressor 5mg  TECHNIQUE: The patient was scanned on a Siemens Force 324 slice scanner. Gantry rotation speed was 250 msecs. Collimation was .6 mm. A 100 kV prospective scan was triggered in the ascending thoracic aorta at 140 HU's Full mA was used between 35% and 75% of the R-R interval. Average HR during the scan was 68 bpm. The 3D data set was interpreted on a dedicated work station using MPR, MIP and VRT modes. A total of 80cc of contrast was used. FINDINGS: Non-cardiac: See separate report from [facility]. No significant findings on limited lung and soft tissue windows. Calcium Score: 3 vessel coronary calcium noted Coronary  Arteries: Left  dominant with no anomalies LM: Normal LAD: 50% or less multiple mixed plaque in mid vessel. IM: 50% multiple calcific plaque D1: 50% multiple calcific plaque D2: Normal Circumflex: 50% calcific plaque in mid vessel OM1: 50% calcific plaque OM2: Normal PDA: Normal PLB: Normal RCA: Less than 30% calcific plaque proximally IMPRESSION: 1. Calcium score 304 which is 73 th percentile for age and sex 2. Left dominant coronary arteries with moderate appearing stenosis in mid LAD/Circumflex as well as IM/D1 and OM1 study to be sent for FFR CT 3.  Normal aortic root 3.5 cm Jenkins Rouge Electronically Signed: By: Jenkins Rouge M.D. On: 12/22/2018 13:24   Ct Coronary Fractional Flow Reserve Data Prep  Result Date: 12/26/2018 CLINICAL DATA:  CAD EXAM: FFR CT TECHNIQUE: The best systolic and diastolic phases of the patients gated cardiac CTA sent to heart flow for analysis FINDINGS: RCA non dominant 0.89 Mid LAD positive 0.69 Mid circumflex 0.81 Distal circumflex 0.78 OM 1 0.71 OM 2 0.70 IMPRESSION: FFR CT positive in mid LAD and OM1 and OM2 patient will be referred for heart catheterization mid circumflex is borderline Jenkins Rouge Area LLPV:   Ostium  Area RUPV:  Ostium   Area RLPV:  Ostium   Area 1. 2. 3. 4. 5. Electronically Signed   By: Jenkins Rouge M.D.   On: 12/26/2018 09:28   Disposition   Pt is being discharged home today in good condition.  Follow-up Plans & Appointments    Follow-up Information    Josue Hector, MD. Go on 02/06/2019.   Specialty:  Cardiology Why:  @10am  for cath follow up  Contact information: [address] [place] [address] [phone]          Discharge Instructions    Amb Referral to Cardiac Rehabilitation   Complete by:  As directed    Diagnosis:  Coronary Stents      Discharge Medications   Allergies as of 01/20/2019   No Known Allergies     Medication List    TAKE these medications   aspirin EC 81 MG tablet Take 1  tablet (81 mg total) by mouth daily. What changed:  when to take this   chlorthalidone 25 MG tablet Commonly known as:  HYGROTON Take 1 tablet (25 mg total) by mouth daily.   clopidogrel 75 MG tablet Commonly known as:  PLAVIX Take 1 tablet (75 mg total) by mouth daily with breakfast. Start taking on:  January 21, 2019   ibuprofen 200 MG tablet Commonly known as:  ADVIL,MOTRIN Take 200-400 mg by mouth daily as needed for moderate pain.   irbesartan 300 MG tablet Commonly known as:  AVAPRO Take 1 tablet (300 mg total) by mouth daily. What changed:  when to take this   levothyroxine 75 MCG tablet Commonly known as:  SYNTHROID, LEVOTHROID Take 1 tablet (75 mcg total) by mouth daily.   loratadine 10 MG tablet Commonly known as:  CLARITIN Take 10 mg by mouth daily as needed for allergies.   multivitamin with minerals Tabs tablet Take 1 tablet by mouth every evening.   nitroGLYCERIN 0.4 MG SL tablet Commonly known as:  NITROSTAT Place 1 tablet (0.4 mg total) under the tongue every 5 (five) minutes as needed.   rosuvastatin 5 MG tablet Commonly known as:  CRESTOR Take 1 tablet (5 mg total) by mouth as directed. Take on Monday - Wednesday - Friday only   SAW PALMETTO PO Take 1 capsule by mouth every evening.        Acute coronary syndrome (MI, NSTEMI, STEMI, etc) this admission?: No.    Outstanding Labs/Studies   None  Duration of Discharge Encounter   Greater than 30 minutes including physician time.  Signed, Leanor Kail, PA 01/20/2019, 2:26 PM

## 2019-01-20 NOTE — Progress Notes (Signed)
[id number] Education completed with pt who voiced understanding. Stressed importance of plavix with stent. Reviewed NTG use, ex ed, heart healthy food choices and risk factors. Pt stated he has quit smoking and he was congratulated. Has been over 2 months. Discussed CRP 2 and referred to [place]. Graylon Good RN BSN 01/20/2019 2:03 PM

## 2019-01-23 ENCOUNTER — Encounter (HOSPITAL_COMMUNITY): Payer: Self-pay | Admitting: Cardiology

## 2019-02-01 NOTE — Progress Notes (Signed)
Cardiology Office Note   Date:  02/06/2019   ID:  Henry Owen, DOB 29-Dec-1960, MRN [id number]  PCP:  Janith Lima, MD  Cardiologist:   Jenkins Rouge, MD   No chief complaint on file.     History of Present Illness:  [name]. Previous smoker recently quitting, HTN. SSCP atypical Cardiac CT done 12/22/18 with calcium score 304 90 th percentile Significant CAD with positive FFR CT worse in mid LAD CAth 01/20/19 resulted in DES to proximal LAD and OM2  Had residual 85% lesion in D1 small for intervention and residual distal LAD disease Left dominant  Circulation D/C with ASA/Plavix and statin   Has not needed nitro Has new primary in 3 weeks [name] [employer]entary works for [employer] from home last 15 years     Past Medical History:  Diagnosis Date  . BPH with obstruction/lower urinary tract symptoms   . CAD (coronary artery disease), native coronary artery 01/20/2019   cath a. 90% OM 2 S/p DES; 70% pLAD (FFR 0.77) s/p DES; 85% 1st dig - not good target  . Enlarged prostate   . Hypertension   . Hypothyroidism   . Tobacco use   . Warts, genital     Past Surgical History:  Procedure Laterality Date  . CORONARY STENT INTERVENTION N/A 01/20/2019   Procedure: CORONARY STENT INTERVENTION;  Surgeon: Leonie Man, MD;  Location: [facility];  Service: Cardiovascular;  Laterality: N/A;  . INTRAVASCULAR PRESSURE WIRE/FFR STUDY N/A 01/20/2019   Procedure: INTRAVASCULAR PRESSURE WIRE/FFR STUDY;  Surgeon: Leonie Man, MD;  Location: [facility];  Service: Cardiovascular;  Laterality: N/A;  . KNEE ARTHROSCOPY  2000  . LASIK  2005  . LEFT HEART CATH AND CORONARY ANGIOGRAPHY N/A 01/20/2019   Procedure: LEFT HEART CATH AND CORONARY ANGIOGRAPHY;  Surgeon: Leonie Man, MD;  Location: [facility];  Service: Cardiovascular;  Laterality: N/A;  . WISDOM TOOTH EXTRACTION       Current Outpatient Medications  Medication Sig Dispense Refill  .  aspirin EC 81 MG tablet Take 1 tablet (81 mg total) by mouth daily. (Patient taking differently: Take 81 mg by mouth every evening. ) 90 tablet 3  . chlorthalidone (HYGROTON) 25 MG tablet Take 1 tablet (25 mg total) by mouth daily. 90 tablet 0  . clopidogrel (PLAVIX) 75 MG tablet Take 1 tablet (75 mg total) by mouth daily with breakfast. 30 tablet 11  . irbesartan (AVAPRO) 300 MG tablet Take 1 tablet (300 mg total) by mouth daily. (Patient taking differently: Take 300 mg by mouth every evening. ) 90 tablet 1  . levothyroxine (SYNTHROID, LEVOTHROID) 75 MCG tablet Take 1 tablet (75 mcg total) by mouth daily. 90 tablet 0  . loratadine (CLARITIN) 10 MG tablet Take 10 mg by mouth daily as needed for allergies.    . Multiple Vitamin (MULTIVITAMIN WITH MINERALS) TABS tablet Take 1 tablet by mouth every evening.    . nitroGLYCERIN (NITROSTAT) 0.4 MG SL tablet Place 1 tablet (0.4 mg total) under the tongue every 5 (five) minutes as needed. 25 tablet 12  . rosuvastatin (CRESTOR) 5 MG tablet Take 1 tablet (5 mg total) by mouth as directed. Take on Monday - Wednesday - Friday only 15 tablet 3  . Saw Palmetto, Serenoa repens, (SAW PALMETTO PO) Take 1 capsule by mouth every evening.     No current facility-administered medications for this visit.     Allergies:  Patient has no known allergies.    Social History:  The patient  reports that he quit smoking about 3 months ago. His smoking use included cigarettes. He has a 37.50 pack-year smoking history. He has never used smokeless tobacco. He reports that he does not drink alcohol or use drugs.   Family History:  The patient's family history includes Diabetes in his mother; Early death in an other family member; Heart disease in his father; Hypertension in his father.    ROS:  Please see the history of present illness.   Otherwise, review of systems are positive for none.   All other systems are reviewed and negative.    PHYSICAL EXAM: VS:  BP 120/82    Pulse 84   Ht 6' (1.829 m)   Wt 101.3 kg   SpO2 97%   BMI 30.30 kg/m  , BMI Body mass index is 30.3 kg/m. Affect appropriate Healthy:  appears stated age 75: normal Neck supple with no adenopathy JVP normal no bruits no thyromegaly Lungs clear with no wheezing and good diaphragmatic motion Heart:  S1/S2 no murmur, no rub, gallop or click PMI normal Abdomen: benighn, BS positve, no tenderness, no AAA no bruit.  No HSM or HJR Distal pulses intact with no bruits No edema Neuro non-focal Skin warm and dry No muscular weakness    EKG:  SR LAD tall R in V1  10/06/18     Recent Labs: 10/31/2018: TSH 2.45 01/16/2019: ALT 25; BUN 13; Creatinine, Ser 0.98; Hemoglobin 16.1; Platelets 185; Potassium 3.5; Sodium 140    Lipid Panel    Component Value Date/Time   CHOL 155 10/31/2018   TRIG 141 10/31/2018   HDL 47 10/31/2018   CHOLHDL 5.3 (H) 09/22/2016 0739   VLDL 36 (H) 09/22/2016 0739   LDLCALC 81 10/31/2018      Wt Readings from Last 3 Encounters:  02/06/19 101.3 kg  01/20/19 97.5 kg  01/16/19 99.2 kg      Other studies Reviewed: Additional studies/ records that were reviewed today include: Notes cardiology Dr Stanford Breed 2012 Stress Echo, notes Dr Ronnald Ramp primary ECG and labs .    ASSESSMENT AND PLAN:  1.  CAD post DES to proximal LAD and OM2 with residual small D1 disease 01/20/19 [place] EF normal  Start coreg 6.25 bid  2. HTN:  Well controlled.  Continue current medications and low sodium Dash type diet.   3. Smoking congratulated him on cessation lung windows will be evaluated on CT  4. HLD:  Continue statin LDL is 81 reasonable unless vascular disease documented    Current medicines are reviewed at length with the patient today.  The patient does not have concerns regarding medicines.  The following changes have been made:  Coreg 6.25 bid   Labs/ tests ordered today include: none  No orders of the defined types were placed in this  encounter.    Disposition:   FU with cardiology in 3 months     Signed, Jenkins Rouge, MD  02/06/2019 10:21 AM    [place], [place], [place]  [address] Phone: [phone]; Fax: [phone]

## 2019-02-06 ENCOUNTER — Encounter: Payer: Self-pay | Admitting: Cardiovascular Disease

## 2019-02-06 ENCOUNTER — Ambulatory Visit (INDEPENDENT_AMBULATORY_CARE_PROVIDER_SITE_OTHER): Payer: 59 | Admitting: Cardiovascular Disease

## 2019-02-06 VITALS — BP 120/82 | HR 84 | Ht 72.0 in | Wt 223.4 lb

## 2019-02-06 DIAGNOSIS — E785 Hyperlipidemia, unspecified: Secondary | ICD-10-CM | POA: Diagnosis not present

## 2019-02-06 DIAGNOSIS — I10 Essential (primary) hypertension: Secondary | ICD-10-CM

## 2019-02-06 DIAGNOSIS — I25118 Atherosclerotic heart disease of native coronary artery with other forms of angina pectoris: Secondary | ICD-10-CM | POA: Diagnosis not present

## 2019-02-06 MED ORDER — CARVEDILOL 6.25 MG PO TABS: 6.2500 mg | ORAL_TABLET | Freq: Two times a day (BID) | ORAL | 3 refills | Status: DC

## 2019-02-06 MED ORDER — CLOPIDOGREL BISULFATE 75 MG PO TABS: 75.0000 mg | ORAL_TABLET | Freq: Every day | ORAL | 3 refills | Status: DC

## 2019-02-06 NOTE — Patient Instructions (Addendum)
Medication Instructions:  Your physician has recommended you make the following change in your medication:  1-START Carvedilol 6.25 mg by mouth twice daily.  If you need a refill on your cardiac medications before your next appointment, please call your pharmacy.   Lab work:  If you have labs (blood work) drawn today and your tests are completely normal, you will receive your results only by: Marland Kitchen MyChart Message (if you have MyChart) OR . A paper copy in the mail If you have any lab test that is abnormal or we need to change your treatment, we will call you to review the results.  Testing/Procedures: None ordered today.  Follow-Up: At [facility], you and your health needs are our priority.  As part of our continuing mission to provide you with exceptional heart care, we have created designated Provider Care Teams.  These Care Teams include your primary Cardiologist (physician) and Advanced Practice Providers (APPs -  Physician Assistants and Nurse Practitioners) who all work together to provide you with the care you need, when you need it. You will need a follow up appointment in 3 months.   You may see Jenkins Rouge, MD or one of the following Advanced Practice Providers on your designated Care Team:   Truitt Merle, NP Cecilie Kicks, NP . Kathyrn Drown, NP

## 2019-03-13 ENCOUNTER — Encounter (HOSPITAL_COMMUNITY): Payer: 59

## 2019-03-29 ENCOUNTER — Other Ambulatory Visit: Payer: Self-pay | Admitting: Internal Medicine

## 2019-03-29 DIAGNOSIS — I10 Essential (primary) hypertension: Secondary | ICD-10-CM

## 2019-04-05 ENCOUNTER — Telehealth (HOSPITAL_COMMUNITY): Payer: Self-pay | Admitting: *Deleted

## 2019-04-05 NOTE — Telephone Encounter (Signed)
Called patient again to inform about virtual CR/PR program. Left message for him to call back to let us know if he is interested or not.

## 2019-04-14 ENCOUNTER — Telehealth: Payer: Self-pay

## 2019-04-14 NOTE — Telephone Encounter (Signed)
   Cardiac Questionnaire:    Since your last visit or hospitalization:    1. Have you been having new or worsening chest pain? Still have some chest pain once in a while   2. Have you been having new or worsening shortness of breath? No 3. Have you been having new or worsening leg swelling, wt gain, or increase in abdominal girth (pants fitting more tightly)? No   4. Have you had any passing out spells? NO    *A YES to any of these questions would result in the appointment being kept. *If all the answers to these questions are NO, we should indicate that given the current situation regarding the worldwide coronarvirus pandemic, at the recommendation of the CDC, we are looking to limit gatherings in our waiting area, and thus will reschedule their appointment beyond four weeks from today.   _____________   [id number] Pre-Screening Questions:  . Do you currently have a fever? No . Have you recently travelled on a cruise, internationally, or to [place], [place], [place], [place], [place], or [place], [place] [employer]) ? NO  . Have you been in contact with someone that is currently pending confirmation of Covid19 testing or has been confirmed to have the [place] virus? NO . Are you currently experiencing fatigue or cough? NO

## 2019-04-15 NOTE — Progress Notes (Signed)
Cardiology Office Note   Date:  04/17/2019   ID:  Henry Owen, DOB Jul 26, 1961, MRN [id number]  PCP:  Henry Lima, MD  Cardiologist:   Henry Rouge, MD   No chief complaint on file.     History of Present Illness:  [name]. Previous smoker recently quitting, HTN. SSCP atypical Cardiac CT done 12/22/18 with calcium score 304 90 th percentile Significant CAD with positive FFR CT worse in mid LAD CAth 01/20/19 resulted in DES to proximal LAD and OM2  Had residual 85% lesion in D1 small for intervention and residual distal LAD disease Left dominant  Circulation D/C with ASA/Plavix and statin   Has new primary Henry Owen Works  for [employer] from home last 15 years   Coreg started on 02/06/19 not on nitrates or Ranexa   He has fleeting resting sharp bilateral chest pain that does not sound anginal Needs new nitro and to carry it with him   Past Medical History:  Diagnosis Date  . BPH with obstruction/lower urinary tract symptoms   . CAD (coronary artery disease), native coronary artery 01/20/2019   cath a. 90% OM 2 S/p DES; 70% pLAD (FFR 0.77) s/p DES; 85% 1st dig - not good target  . Enlarged prostate   . Hypertension   . Hypothyroidism   . Tobacco use   . Warts, genital     Past Surgical History:  Procedure Laterality Date  . CORONARY STENT INTERVENTION N/A 01/20/2019   Procedure: CORONARY STENT INTERVENTION;  Surgeon: Henry Man, MD;  Location: [facility];  Service: Cardiovascular;  Laterality: N/A;  . INTRAVASCULAR PRESSURE WIRE/FFR STUDY N/A 01/20/2019   Procedure: INTRAVASCULAR PRESSURE WIRE/FFR STUDY;  Surgeon: Henry Man, MD;  Location: [facility];  Service: Cardiovascular;  Laterality: N/A;  . KNEE ARTHROSCOPY  2000  . LASIK  2005  . LEFT HEART CATH AND CORONARY ANGIOGRAPHY N/A 01/20/2019   Procedure: LEFT HEART CATH AND CORONARY ANGIOGRAPHY;  Surgeon: Henry Man, MD;  Location: [facility];  Service:  Cardiovascular;  Laterality: N/A;  . WISDOM TOOTH EXTRACTION       Current Outpatient Medications  Medication Sig Dispense Refill  . aspirin EC 81 MG tablet Take 1 tablet (81 mg total) by mouth daily. (Patient taking differently: Take 81 mg by mouth every evening. ) 90 tablet 3  . carvedilol (COREG) 6.25 MG tablet Take 1 tablet (6.25 mg total) by mouth 2 (two) times daily. 180 tablet 3  . chlorthalidone (HYGROTON) 25 MG tablet TAKE 1 TABLET BY MOUTH EVERY DAY 90 tablet 0  . clopidogrel (PLAVIX) 75 MG tablet Take 1 tablet (75 mg total) by mouth daily with breakfast. 90 tablet 3  . irbesartan (AVAPRO) 300 MG tablet Take 1 tablet (300 mg total) by mouth daily. (Patient taking differently: Take 300 mg by mouth every evening. ) 90 tablet 1  . levothyroxine (SYNTHROID, LEVOTHROID) 75 MCG tablet Take 1 tablet (75 mcg total) by mouth daily. 90 tablet 0  . loratadine (CLARITIN) 10 MG tablet Take 10 mg by mouth daily as needed for allergies.    . Multiple Vitamin (MULTIVITAMIN WITH MINERALS) TABS tablet Take 1 tablet by mouth every evening.    . nitroGLYCERIN (NITROSTAT) 0.4 MG SL tablet Place 1 tablet (0.4 mg total) under the tongue every 5 (five) minutes as needed. 25 tablet 12  . Saw Palmetto, Serenoa repens, (SAW PALMETTO PO) Take 1 capsule by mouth  every evening.    . rosuvastatin (CRESTOR) 5 MG tablet Take 1 tablet (5 mg total) by mouth as directed. Take on Monday - Wednesday - Friday only 15 tablet 3   No current facility-administered medications for this visit.     Allergies:   Patient has no known allergies.    Social History:  The patient  reports that he quit smoking about 5 months ago. His smoking use included cigarettes. He has a 37.50 pack-year smoking history. He has never used smokeless tobacco. He reports that he does not drink alcohol or use drugs.   Family History:  The patient's family history includes Diabetes in his mother; Early death in an other family member; Heart disease  in his father; Hypertension in his father.    ROS:  Please see the history of present illness.   Otherwise, review of systems are positive for none.   All other systems are reviewed and negative.    PHYSICAL EXAM: VS:  BP 132/82   Pulse 82   Ht 6' (1.829 m)   Wt 103.4 kg   SpO2 98%   BMI 30.92 kg/m  , BMI Body mass index is 30.92 kg/m. Affect appropriate Healthy:  appears stated age 58: normal Neck supple with no adenopathy JVP normal no bruits no thyromegaly Lungs clear with no wheezing and good diaphragmatic motion Heart:  S1/S2 no murmur, no rub, gallop or click PMI normal Abdomen: benighn, BS positve, no tenderness, no AAA no bruit.  No HSM or HJR Distal pulses intact with no bruits No edema Neuro non-focal Skin warm and dry No muscular weakness    EKG:  SR LAD tall R in V1  10/06/18     Recent Labs: 10/31/2018: TSH 2.45 01/16/2019: ALT 25; BUN 13; Creatinine, Ser 0.98; Hemoglobin 16.1; Platelets 185; Potassium 3.5; Sodium 140    Lipid Panel    Component Value Date/Time   CHOL 155 10/31/2018   TRIG 141 10/31/2018   HDL 47 10/31/2018   CHOLHDL 5.3 (H) 09/22/2016 0739   VLDL 36 (H) 09/22/2016 0739   LDLCALC 81 10/31/2018      Wt Readings from Last 3 Encounters:  04/17/19 103.4 kg  02/06/19 101.3 kg  01/20/19 97.5 kg      Other studies Reviewed: Additional studies/ records that were reviewed today include: Notes cardiology Henry Owen 2012 Stress Echo, notes Henry Owen primary ECG and labs .    ASSESSMENT AND PLAN:  1.  CAD post DES to proximal LAD and OM2 with residual small D1 disease 01/20/19 Henry Owen EF normal  Coreg 6.25 bid Need to check P2Y to make sure he is a Plavix responder given 2 vessel PCI     2. HTN:  Well controlled.  Continue current medications and low sodium Dash type diet.   3. Smoking congratulated him on cessation lung windows will be evaluated on CT  4. HLD:  Tolerating low dose crestor lipid and liver in 8 weeks with his P2y    Current medicines are reviewed at length with the patient today.  The patient does not have concerns regarding medicines.  The following changes have been made:  No med changes  Labs/ tests ordered today include: Lipid liver P2Y in 8 weeks  No orders of the defined types were placed in this encounter.    Disposition:   FU with cardiology in 6 months     Signed, Henry Rouge, MD  04/17/2019 3:00 PM    [place]  [address] [place], [place]  [address] Phone: [phone]; Fax: [phone]

## 2019-04-17 ENCOUNTER — Encounter: Payer: Self-pay | Admitting: Cardiovascular Disease

## 2019-04-17 ENCOUNTER — Other Ambulatory Visit: Payer: Self-pay

## 2019-04-17 ENCOUNTER — Ambulatory Visit (INDEPENDENT_AMBULATORY_CARE_PROVIDER_SITE_OTHER): Payer: 59 | Admitting: Cardiovascular Disease

## 2019-04-17 VITALS — BP 132/82 | HR 82 | Ht 72.0 in | Wt 228.0 lb

## 2019-04-17 DIAGNOSIS — Z79899 Other long term (current) drug therapy: Secondary | ICD-10-CM

## 2019-04-17 DIAGNOSIS — E785 Hyperlipidemia, unspecified: Secondary | ICD-10-CM

## 2019-04-17 MED ORDER — NITROGLYCERIN 0.4 MG SL SUBL: 0.4000 mg | SUBLINGUAL_TABLET | SUBLINGUAL | 3 refills | Status: DC | PRN

## 2019-04-17 NOTE — Patient Instructions (Addendum)
Medication Instructions:   If you need a refill on your cardiac medications before your next appointment, please call your pharmacy.   Lab work: Your physician recommends that you return for lab work at [facility] for lipid and liver panel and P2Y12  If you have labs (blood work) drawn today and your tests are completely normal, you will receive your results only by: Marland Kitchen MyChart Message (if you have MyChart) OR . A paper copy in the mail If you have any lab test that is abnormal or we need to change your treatment, we will call you to review the results.  Testing/Procedures: None ordered today.  Follow-Up: At [facility], you and your health needs are our priority.  As part of our continuing mission to provide you with exceptional heart care, we have created designated Provider Care Teams.  These Care Teams include your primary Cardiologist (physician) and Advanced Practice Providers (APPs -  Physician Assistants and Nurse Practitioners) who all work together to provide you with the care you need, when you need it. You will need a follow up appointment in 6 months.  Please call our office 2 months in advance to schedule this appointment.  You may see Jenkins Rouge, MD or one of the following Advanced Practice Providers on your designated Care Team:   Truitt Merle, NP Cecilie Kicks, NP . Kathyrn Drown, NP

## 2019-04-20 ENCOUNTER — Ambulatory Visit: Payer: 59 | Admitting: Cardiovascular Disease

## 2019-05-16 ENCOUNTER — Ambulatory Visit: Payer: 59 | Admitting: Cardiovascular Disease

## 2019-06-07 ENCOUNTER — Other Ambulatory Visit: Payer: Self-pay | Admitting: Nurse Practitioner

## 2019-06-22 ENCOUNTER — Other Ambulatory Visit: Payer: Self-pay | Admitting: Internal Medicine

## 2019-06-22 DIAGNOSIS — E039 Hypothyroidism, unspecified: Secondary | ICD-10-CM

## 2019-06-22 DIAGNOSIS — I10 Essential (primary) hypertension: Secondary | ICD-10-CM

## 2019-06-27 ENCOUNTER — Ambulatory Visit (INDEPENDENT_AMBULATORY_CARE_PROVIDER_SITE_OTHER): Payer: 59 | Admitting: Internal Medicine

## 2019-06-27 ENCOUNTER — Other Ambulatory Visit: Payer: Self-pay | Admitting: Internal Medicine

## 2019-06-27 ENCOUNTER — Encounter: Payer: Self-pay | Admitting: Internal Medicine

## 2019-06-27 ENCOUNTER — Other Ambulatory Visit: Payer: Self-pay

## 2019-06-27 ENCOUNTER — Other Ambulatory Visit (INDEPENDENT_AMBULATORY_CARE_PROVIDER_SITE_OTHER): Payer: 59

## 2019-06-27 VITALS — BP 160/102 | HR 67 | Temp 98.1°F | Resp 16 | Ht 72.0 in | Wt 227.0 lb

## 2019-06-27 DIAGNOSIS — I25118 Atherosclerotic heart disease of native coronary artery with other forms of angina pectoris: Secondary | ICD-10-CM | POA: Diagnosis not present

## 2019-06-27 DIAGNOSIS — E039 Hypothyroidism, unspecified: Secondary | ICD-10-CM

## 2019-06-27 DIAGNOSIS — I10 Essential (primary) hypertension: Secondary | ICD-10-CM | POA: Diagnosis not present

## 2019-06-27 DIAGNOSIS — L309 Dermatitis, unspecified: Secondary | ICD-10-CM

## 2019-06-27 LAB — URINALYSIS, ROUTINE W REFLEX MICROSCOPIC
Bilirubin Urine: NEGATIVE
Hgb urine dipstick: NEGATIVE
Leukocytes,Ua: NEGATIVE
Nitrite: NEGATIVE
RBC / HPF: NONE SEEN (ref 0–?)
Specific Gravity, Urine: 1.02 (ref 1.000–1.030)
Total Protein, Urine: NEGATIVE
Urine Glucose: NEGATIVE
Urobilinogen, UA: 0.2 (ref 0.0–1.0)
pH: 6.5 (ref 5.0–8.0)

## 2019-06-27 LAB — CBC WITH DIFFERENTIAL/PLATELET
Basophils Absolute: 0.1 10*3/uL (ref 0.0–0.1)
Basophils Relative: 0.5 % (ref 0.0–3.0)
Eosinophils Absolute: 0.1 10*3/uL (ref 0.0–0.7)
Eosinophils Relative: 0.8 % (ref 0.0–5.0)
HCT: 45.1 % (ref 39.0–52.0)
Hemoglobin: 15.8 g/dL (ref 13.0–17.0)
Lymphocytes Relative: 11 % — ABNORMAL LOW (ref 12.0–46.0)
Lymphs Abs: 1.1 10*3/uL (ref 0.7–4.0)
MCHC: 35.1 g/dL (ref 30.0–36.0)
MCV: 94.2 fl (ref 78.0–100.0)
Monocytes Absolute: 0.8 10*3/uL (ref 0.1–1.0)
Monocytes Relative: 8.2 % (ref 3.0–12.0)
Neutro Abs: 7.8 10*3/uL — ABNORMAL HIGH (ref 1.4–7.7)
Neutrophils Relative %: 79.5 % — ABNORMAL HIGH (ref 43.0–77.0)
Platelets: 184 10*3/uL (ref 150.0–400.0)
RBC: 4.79 Mil/uL (ref 4.22–5.81)
RDW: 13.5 % (ref 11.5–15.5)
WBC: 9.9 10*3/uL (ref 4.0–10.5)

## 2019-06-27 LAB — BASIC METABOLIC PANEL
BUN: 10 mg/dL (ref 6–23)
CO2: 28 mEq/L (ref 19–32)
Calcium: 9.8 mg/dL (ref 8.4–10.5)
Chloride: 103 mEq/L (ref 96–112)
Creatinine, Ser: 0.98 mg/dL (ref 0.40–1.50)
GFR: 78.47 mL/min (ref >60.00)
Glucose, Bld: 99 mg/dL (ref 70–99)
Potassium: 4.1 mEq/L (ref 3.5–5.1)
Sodium: 139 mEq/L (ref 135–145)

## 2019-06-27 LAB — TROPONIN I: TNIDX: 0 ug/l (ref 0.00–0.06)

## 2019-06-27 LAB — VITAMIN D 25 HYDROXY (VIT D DEFICIENCY, FRACTURES): VITD: 30.19 ng/mL (ref 30.00–100.00)

## 2019-06-27 LAB — TSH: TSH: 2.79 u[IU]/mL (ref 0.35–4.50)

## 2019-06-27 MED ORDER — EUCRISA 2 % EX OINT: 1.0000 | TOPICAL_OINTMENT | Freq: Two times a day (BID) | CUTANEOUS | 5 refills | Status: DC

## 2019-06-27 MED ORDER — LEVOTHYROXINE SODIUM 75 MCG PO TABS: 75.0000 ug | ORAL_TABLET | Freq: Every day | ORAL | 1 refills | Status: DC

## 2019-06-27 MED ORDER — IRBESARTAN 300 MG PO TABS: 300.0000 mg | ORAL_TABLET | Freq: Every evening | ORAL | 1 refills | Status: DC

## 2019-06-27 NOTE — Progress Notes (Signed)
Subjective:  Patient ID: Henry Owen, male    DOB: 05-23-1961  Age: 58 y.o. MRN: [id number]  CC: Hypertension, Hypothyroidism, and Coronary Artery Disease   HPI Henry Owen presents for f/up- He tells me that his blood pressure has been well controlled over the last few weeks until just a couple of days ago.  He says that over the last 3 or 4 days he has been consuming a lot of Gatorade and he thinks that that has elevated his blood pressure.  He has not been working on his lifestyle modifications.  He denies headache, blurred vision, chest pain, shortness of breath, DOE, or edema.  He continues to complain of intermittent fatigue and constipation.  The constipation is adequately treated with over-the-counter remedies.  He also complains of a recurrent itchy rash over his right calf.  Outpatient Medications Prior to Visit  Medication Sig Dispense Refill   aspirin EC 81 MG tablet Take 1 tablet (81 mg total) by mouth daily. (Patient taking differently: Take 81 mg by mouth every evening. ) 90 tablet 3   carvedilol (COREG) 6.25 MG tablet Take 1 tablet (6.25 mg total) by mouth 2 (two) times daily. 180 tablet 3   chlorthalidone (HYGROTON) 25 MG tablet TAKE 1 TABLET BY MOUTH EVERY DAY 90 tablet 0   clopidogrel (PLAVIX) 75 MG tablet Take 1 tablet (75 mg total) by mouth daily with breakfast. 90 tablet 3   loratadine (CLARITIN) 10 MG tablet Take 10 mg by mouth daily as needed for allergies.     Multiple Vitamin (MULTIVITAMIN WITH MINERALS) TABS tablet Take 1 tablet by mouth every evening.     nitroGLYCERIN (NITROSTAT) 0.4 MG SL tablet Place 1 tablet (0.4 mg total) under the tongue every 5 (five) minutes as needed. 25 tablet 3   rosuvastatin (CRESTOR) 5 MG tablet TAKE 1 TABLET (5 MG TOTAL) BY MOUTH AS DIRECTED. TAKE ON MONDAY - WEDNESDAY - FRIDAY ONLY 36 tablet 2   Saw Palmetto, Serenoa repens, (SAW PALMETTO PO) Take 1 capsule by mouth every evening.     irbesartan (AVAPRO) 300 MG  tablet Take 1 tablet (300 mg total) by mouth daily. (Patient taking differently: Take 300 mg by mouth every evening. ) 90 tablet 1   levothyroxine (SYNTHROID, LEVOTHROID) 75 MCG tablet Take 1 tablet (75 mcg total) by mouth daily. 90 tablet 0   No facility-administered medications prior to visit.     ROS Review of Systems  Constitutional: Positive for fatigue. Negative for appetite change, chills, diaphoresis and unexpected weight change.  HENT: Negative.   Eyes: Negative for visual disturbance.  Respiratory: Negative for cough, chest tightness, shortness of breath and wheezing.   Cardiovascular: Negative for chest pain, palpitations and leg swelling.  Gastrointestinal: Positive for constipation. Negative for abdominal distention, abdominal pain, blood in stool, diarrhea, nausea and vomiting.  Endocrine: Negative.  Negative for cold intolerance and heat intolerance.  Genitourinary: Negative.  Negative for difficulty urinating, dysuria and hematuria.  Musculoskeletal: Negative.  Negative for arthralgias and myalgias.  Skin: Negative.  Negative for color change.  Neurological: Negative.  Negative for dizziness, weakness, light-headedness and headaches.  Hematological: Negative for adenopathy. Does not bruise/bleed easily.  Psychiatric/Behavioral: Negative.     Objective:  BP (!) 160/102 (BP Location: Left Arm, Patient Position: Sitting, Cuff Size: Normal)    Pulse 67    Temp 98.1 F (36.7 C) (Oral)    Resp 16    Ht 6' (1.829 m)  Wt 227 lb (103 kg)    SpO2 97%    BMI 30.79 kg/m   BP Readings from Last 3 Encounters:  06/27/19 (!) 160/102  04/17/19 132/82  02/06/19 120/82    Wt Readings from Last 3 Encounters:  06/27/19 227 lb (103 kg)  04/17/19 228 lb (103.4 kg)  02/06/19 223 lb 6.4 oz (101.3 kg)    Physical Exam Vitals signs reviewed.  Constitutional:      Appearance: He is obese. He is not ill-appearing or diaphoretic.  HENT:     Nose: Nose normal.     Mouth/Throat:      Mouth: Mucous membranes are moist.     Pharynx: No oropharyngeal exudate.  Eyes:     General: No scleral icterus.    Conjunctiva/sclera: Conjunctivae normal.  Neck:     Musculoskeletal: Normal range of motion. No neck rigidity.  Cardiovascular:     Rate and Rhythm: Normal rate and regular rhythm.     Heart sounds: No murmur. No gallop.   Pulmonary:     Effort: Pulmonary effort is normal.     Breath sounds: No stridor. No wheezing, rhonchi or rales.  Abdominal:     General: Abdomen is flat. Bowel sounds are normal. There is no distension.     Palpations: There is no hepatomegaly, splenomegaly or mass.     Tenderness: There is no abdominal tenderness.  Musculoskeletal: Normal range of motion.     Right lower leg: No edema.     Left lower leg: No edema.       Legs:  Skin:    General: Skin is warm and dry.     Coloration: Skin is not pale.  Neurological:     General: No focal deficit present.     Mental Status: He is alert and oriented to person, place, and time. Mental status is at baseline.  Psychiatric:        Mood and Affect: Mood normal.        Behavior: Behavior normal.     Lab Results  Component Value Date   WBC 9.9 06/27/2019   HGB 15.8 06/27/2019   HCT 45.1 06/27/2019   PLT 184.0 06/27/2019   GLUCOSE 99 06/27/2019   CHOL 155 10/31/2018   TRIG 141 10/31/2018   HDL 47 10/31/2018   LDLCALC 81 10/31/2018   ALT 25 01/16/2019   AST 25 01/16/2019   NA 139 06/27/2019   K 4.1 06/27/2019   CL 103 06/27/2019   CREATININE 0.98 06/27/2019   BUN 10 06/27/2019   CO2 28 06/27/2019   TSH 2.79 06/27/2019   PSA 0.3 10/31/2018   HGBA1C 5.1 04/21/2018    No results found.  Assessment & Plan:   Henry Owen was seen today for hypertension, hypothyroidism and coronary artery disease.  Diagnoses and all orders for this visit:  Coronary artery disease of native artery of native heart with stable angina pectoris [facility])- He has had no recent episodes of angina.  Will continue to  work on risk factor modifications. -     Troponin I -; Future -     irbesartan (AVAPRO) 300 MG tablet; Take 1 tablet (300 mg total) by mouth every evening.  Essential hypertension, benign- His blood pressure is not adequately well controlled.  He is not willing to add another antihypertensive.  He agrees to improve his lifestyle modifications and to decrease his intake of sodium. -     CBC with Differential/Platelet; Future -     Urinalysis,  Routine w reflex microscopic; Future -     VITAMIN D 25 Hydroxy (Vit-D Deficiency, Fractures); Future -     Basic metabolic panel; Future -     irbesartan (AVAPRO) 300 MG tablet; Take 1 tablet (300 mg total) by mouth every evening.  Eczema, unspecified type -     Crisaborole (EUCRISA) 2 % OINT; Apply 1 Act topically 2 (two) times a day.  Acquired hypothyroidism- His TSH is in the normal range.  He will remain on the current dose of levothyroxine. -     TSH; Future -     levothyroxine (SYNTHROID) 75 MCG tablet; Take 1 tablet (75 mcg total) by mouth daily.  Hypothyroidism, unspecified type   I have changed Dellis Filbert H. Waldron's irbesartan and levothyroxine. I am also having him start on [place]. Additionally, I am having him maintain his aspirin EC, multivitamin with minerals, (Saw Palmetto, Serenoa repens, (SAW PALMETTO PO)), loratadine, carvedilol, clopidogrel, chlorthalidone, nitroGLYCERIN, and rosuvastatin.  Meds ordered this encounter  Medications   Crisaborole (EUCRISA) 2 % OINT    Sig: Apply 1 Act topically 2 (two) times a day.    Dispense:  100 g    Refill:  5   irbesartan (AVAPRO) 300 MG tablet    Sig: Take 1 tablet (300 mg total) by mouth every evening.    Dispense:  90 tablet    Refill:  1   levothyroxine (SYNTHROID) 75 MCG tablet    Sig: Take 1 tablet (75 mcg total) by mouth daily.    Dispense:  90 tablet    Refill:  1     Follow-up: Return in about 2 months (around 08/28/2019).  [name], MD

## 2019-06-27 NOTE — Patient Instructions (Signed)

## 2019-07-07 ENCOUNTER — Other Ambulatory Visit: Payer: Self-pay | Admitting: Internal Medicine

## 2019-07-07 DIAGNOSIS — I10 Essential (primary) hypertension: Secondary | ICD-10-CM

## 2019-07-07 MED ORDER — CHLORTHALIDONE 25 MG PO TABS: 25.0000 mg | ORAL_TABLET | Freq: Every day | ORAL | 1 refills | Status: DC

## 2019-10-24 NOTE — Progress Notes (Signed)
Cardiology Office Note   Date:  10/26/2019   ID:  Henry Owen, DOB 16-Jun-1961, MRN [id number]  PCP:  Janith Lima, MD  Cardiologist:   Jenkins Rouge, MD   No chief complaint on file.     History of Present Illness:  [name]. Previous smoker,, HTN. SSCP atypical Cardiac CT done 12/22/18 with calcium score 304 90 th percentile Significant CAD with positive FFR CT worse in mid LAD CAth 01/20/19 resulted in DES to proximal LAD and OM2  Had residual 85% lesion in D1 small for intervention and residual distal LAD disease Left dominant  Circulation D/C with ASA/Plavix and statin   Has new primary [name] Works  for [employer] from home last 15 years   Coreg started on 02/06/19 not on nitrates or Ranexa   He has recurrent Eczema particularly over right calf. Started on Eucrisa ointment by primary Dr Milus Banister increased for HTN    Still working from home Has 3 cats Will not get flu shot No angina Discussed alternatives to statins He could not tolerate crestor and has high calcium score with LDL above 70   Past Medical History:  Diagnosis Date  . BPH with obstruction/lower urinary tract symptoms   . CAD (coronary artery disease), native coronary artery 01/20/2019   cath a. 90% OM 2 S/p DES; 70% pLAD (FFR 0.77) s/p DES; 85% 1st dig - not good target  . Enlarged prostate   . Hypertension   . Hypothyroidism   . Tobacco use   . Warts, genital     Past Surgical History:  Procedure Laterality Date  . CORONARY STENT INTERVENTION N/A 01/20/2019   Procedure: CORONARY STENT INTERVENTION;  Surgeon: Leonie Man, MD;  Location: [place] [facility];  Service: Cardiovascular;  Laterality: N/A;  . INTRAVASCULAR PRESSURE WIRE/FFR STUDY N/A 01/20/2019   Procedure: INTRAVASCULAR PRESSURE WIRE/FFR STUDY;  Surgeon: Leonie Man, MD;  Location: [facility];  Service: Cardiovascular;  Laterality: N/A;  . KNEE ARTHROSCOPY  2000  . LASIK  2005  . LEFT HEART CATH AND  CORONARY ANGIOGRAPHY N/A 01/20/2019   Procedure: LEFT HEART CATH AND CORONARY ANGIOGRAPHY;  Surgeon: Leonie Man, MD;  Location: [place] [facility];  Service: Cardiovascular;  Laterality: N/A;  . WISDOM TOOTH EXTRACTION       Current Outpatient Medications  Medication Sig Dispense Refill  . aspirin EC 81 MG tablet Take 1 tablet (81 mg total) by mouth daily. (Patient taking differently: Take 81 mg by mouth every evening. ) 90 tablet 3  . carvedilol (COREG) 6.25 MG tablet Take 1 tablet (6.25 mg total) by mouth 2 (two) times daily. 180 tablet 3  . chlorthalidone (HYGROTON) 25 MG tablet Take 1 tablet (25 mg total) by mouth daily. 90 tablet 1  . clopidogrel (PLAVIX) 75 MG tablet Take 1 tablet (75 mg total) by mouth daily with breakfast. 90 tablet 3  . Crisaborole (EUCRISA) 2 % OINT Apply 1 Act topically 2 (two) times a day. 100 g 5  . irbesartan (AVAPRO) 300 MG tablet Take 1 tablet (300 mg total) by mouth every evening. 90 tablet 1  . levothyroxine (SYNTHROID) 75 MCG tablet Take 1 tablet (75 mcg total) by mouth daily. 90 tablet 1  . nitroGLYCERIN (NITROSTAT) 0.4 MG SL tablet Place 1 tablet (0.4 mg total) under the tongue every 5 (five) minutes as needed. 25 tablet 3   No current facility-administered medications for this visit.  Allergies:   Patient has no known allergies.    Social History:  The patient  reports that he quit smoking about a year ago. His smoking use included cigarettes. He has a 37.50 pack-year smoking history. He has never used smokeless tobacco. He reports that he does not drink alcohol or use drugs.   Family History:  The patient's family history includes Diabetes in his mother; Early death in an other family member; Heart disease in his father; Hypertension in his father.    ROS:  Please see the history of present illness.   Otherwise, review of systems are positive for none.   All other systems are reviewed and negative.    PHYSICAL EXAM: VS:  BP 122/82    Pulse 74   Ht 6' (1.829 m)   Wt 223 lb (101.2 kg)   SpO2 98%   BMI 30.24 kg/m  , BMI Body mass index is 30.24 kg/m. Affect appropriate Healthy:  appears stated age 60: normal Neck supple with no adenopathy JVP normal no bruits no thyromegaly Lungs clear with no wheezing and good diaphragmatic motion Heart:  S1/S2 no murmur, no rub, gallop or click PMI normal Abdomen: benighn, BS positve, no tenderness, no AAA no bruit.  No HSM or HJR Distal pulses intact with no bruits No edema Neuro non-focal Skin erythema/eczema on right forearm and calf  No muscular weakness    EKG:  SR LAD tall R in V1  10/06/18     Recent Labs: 01/16/2019: ALT 25 06/27/2019: BUN 10; Creatinine, Ser 0.98; Hemoglobin 15.8; Platelets 184.0; Potassium 4.1; Sodium 139; TSH 2.79    Lipid Panel    Component Value Date/Time   CHOL 155 10/31/2018   TRIG 141 10/31/2018   HDL 47 10/31/2018   CHOLHDL 5.3 (H) 09/22/2016 0739   VLDL 36 (H) 09/22/2016 0739   LDLCALC 81 10/31/2018      Wt Readings from Last 3 Encounters:  10/26/19 223 lb (101.2 kg)  06/27/19 227 lb (103 kg)  04/17/19 228 lb (103.4 kg)      Other studies Reviewed: Additional studies/ records that were reviewed today include: Notes cardiology Dr Stanford Breed 2012 Stress Echo, notes Dr Ronnald Ramp primary ECG and labs .    ASSESSMENT AND PLAN:  1.  CAD post DES to proximal LAD and OM2 with residual small D1 disease 01/20/19 Ellyn Hack EF normal  Coreg 6.25 bid Need to check P2Y to make sure he is a Plavix responder given 2 vessel PCI  This was ordered 04/17/19 but never done    2. HTN:  Well controlled.  Continue current medications and low sodium Dash type diet.   3. Smoking congratulated him on cessation lung windows will be evaluated on CT  4. HLD: check labs today refer to lipid clinic for statin alternative    Current medicines are reviewed at length with the patient today.  The patient does not have concerns regarding medicines.  The  following changes have been made:  D/c Crestor   Labs/ tests ordered today include: Lipid liver P2Y    Disposition:   FU with lipid clinic and me in a year     Signed, Jenkins Rouge, MD  10/26/2019 8:56 AM    [facility] [address], [place], [place]  [address] Phone: [phone]; Fax: [phone]

## 2019-10-26 ENCOUNTER — Ambulatory Visit (INDEPENDENT_AMBULATORY_CARE_PROVIDER_SITE_OTHER): Payer: 59 | Admitting: Cardiovascular Disease

## 2019-10-26 ENCOUNTER — Encounter: Payer: Self-pay | Admitting: Cardiovascular Disease

## 2019-10-26 ENCOUNTER — Other Ambulatory Visit (HOSPITAL_COMMUNITY)
Admission: AD | Admit: 2019-10-26 | Discharge: 2019-10-26 | Disposition: A | Discharge status: Home / Self Care | Payer: 59 | Source: Ambulatory Visit | Attending: Cardiovascular Disease | Admitting: Cardiovascular Disease

## 2019-10-26 ENCOUNTER — Other Ambulatory Visit: Payer: Self-pay

## 2019-10-26 VITALS — BP 122/82 | HR 74 | Ht 72.0 in | Wt 223.0 lb

## 2019-10-26 DIAGNOSIS — Z789 Other specified health status: Secondary | ICD-10-CM | POA: Insufficient documentation

## 2019-10-26 DIAGNOSIS — E785 Hyperlipidemia, unspecified: Secondary | ICD-10-CM

## 2019-10-26 LAB — HEPATIC FUNCTION PANEL
ALT: 18 IU/L (ref 0–44)
AST: 17 IU/L (ref 0–40)
Albumin: 4.4 g/dL (ref 3.8–4.9)
Alkaline Phosphatase: 66 IU/L (ref 39–117)
Bilirubin Total: 0.6 mg/dL (ref 0.0–1.2)
Bilirubin, Direct: 0.13 mg/dL (ref 0.00–0.40)
Total Protein: 7.6 g/dL (ref 6.0–8.5)

## 2019-10-26 LAB — LIPID PANEL
Chol/HDL Ratio: 5.2 ratio — ABNORMAL HIGH (ref 0.0–5.0)
Cholesterol, Total: 202 mg/dL — ABNORMAL HIGH (ref 100–199)
HDL: 39 mg/dL — ABNORMAL LOW (ref >39)
LDL Chol Calc (NIH): 138 mg/dL — ABNORMAL HIGH (ref 0–99)
Triglycerides: 138 mg/dL (ref 0–149)
VLDL Cholesterol Cal: 25 mg/dL (ref 5–40)

## 2019-10-26 LAB — PLATELET INHIBITION P2Y12: Platelet Function  P2Y12: 122 [PRU] — ABNORMAL LOW (ref 182–335)

## 2019-10-26 NOTE — Patient Instructions (Signed)
Medication Instructions:   *If you need a refill on your cardiac medications before your next appointment, please call your pharmacy*  Lab Work: Your physician recommends that you have lab work today- Fasting lipid and liver panel. Your physician recommends that you have lab work done at the hospital lab at [facility] for P2Y   If you have labs (blood work) drawn today and your tests are completely normal, you will receive your results only by: Marland Kitchen MyChart Message (if you have MyChart) OR . A paper copy in the mail If you have any lab test that is abnormal or we need to change your treatment, we will call you to review the results.  Testing/Procedures: None ordered today.  Follow-Up: At [facility], you and your health needs are our priority.  As part of our continuing mission to provide you with exceptional heart care, we have created designated Provider Care Teams.  These Care Teams include your primary Cardiologist (physician) and Advanced Practice Providers (APPs -  Physician Assistants and Nurse Practitioners) who all work together to provide you with the care you need, when you need it.  Your next appointment:   12 months  The format for your next appointment:   In Person  Provider:   You may see Jenkins Rouge, MD or one of the following Advanced Practice Providers on your designated Care Team:    Truitt Merle, NP  Cecilie Kicks, NP  Kathyrn Drown, NP  You have been referred to [facility].

## 2019-11-03 ENCOUNTER — Ambulatory Visit (INDEPENDENT_AMBULATORY_CARE_PROVIDER_SITE_OTHER): Payer: 59

## 2019-11-03 ENCOUNTER — Other Ambulatory Visit: Payer: Self-pay

## 2019-11-03 VITALS — BP 118/84 | HR 70 | Ht 72.0 in | Wt 223.0 lb

## 2019-11-03 DIAGNOSIS — E785 Hyperlipidemia, unspecified: Secondary | ICD-10-CM | POA: Diagnosis not present

## 2019-11-03 DIAGNOSIS — I25118 Atherosclerotic heart disease of native coronary artery with other forms of angina pectoris: Secondary | ICD-10-CM

## 2019-11-03 MED ORDER — PRALUENT 75 MG/ML ~~LOC~~ SOAJ: 1.0000 "pen " | SUBCUTANEOUS | 3 refills | Status: DC

## 2019-11-03 NOTE — Patient Instructions (Addendum)
It was nice to see you today!  We will call your pharmacy to get your prescription insurance information.  Then I'll send the paperwork to your insurance company for approval for either [place] or [place].   I will call you once it's approved and sent to the pharmacy with the copay card information.   We can then schedule ~2 month repeat cholesterol check after you start this medicine.   Phone: [phone]

## 2019-11-03 NOTE — Progress Notes (Signed)
Patient ID: Henry Owen                 DOB: [birth year]                    MRN: [id number]     HPI: Henry Owen is a 58 y.o. male patient referred to lipid clinic by Dr. Johnsie Cancel. PMH is significant for HTN, CAD (calcium score 304 - 90th percentile, positive FFR CT, DES to proxLAD and OM2 on 01/20/19) and tobacco abuse.   He was last seen by Dr Johnsie Cancel on 10/26/19 and lipid panel resulted with LDL of 138. Patient was then referred to lipid clinic for consideration of PCSK9-I in the setting of statin intolerance.  Patient presents for initial visit to lipid clinic. He reports that he has tried Crestor 5 mg once daily and this caused myalgias within a few weeks. He was then switched over to Livalo 1 mg daily but this was not preferred on his insurance plan and therefore, pt never picked up prescription due to cost. It was then recommended to try Crestor 5 mg on MWF and pt said he had developed myalgias within a couple months. He says has been off statins for about a month now.     Current Medications: none Intolerances: Crestor 5 mg daily and MWF (myalgias) Risk Factors: HTN, CAD s/p DES in Feb 2020, tobacco abuse, family history LDL goal: <70  Diet: spaghetti, hamburgers and grilled chicken, red meat and dairy products, eggs, choosing low fat options, does not add much salt to food  Exercise: trying to increase exercise but somewhat limited with feeling "foggy" and limited motivation   Family History: Diabetes in his mother; Early death in an other family member; Heart disease in his father; Hypertension in his father.   Social History: former smoker - quit 1 year ago, no alcohol   Labs: 10/26/19: TC 202, TG 138, HDL 39, LDL 138 - none  Past Medical History:  Diagnosis Date  . BPH with obstruction/lower urinary tract symptoms   . CAD (coronary artery disease), native coronary artery 01/20/2019   cath a. 90% OM 2 S/p DES; 70% pLAD (FFR 0.77) s/p DES; 85% 1st dig - not good target  .  Enlarged prostate   . Hypertension   . Hypothyroidism   . Tobacco use   . Warts, genital     Current Outpatient Medications on File Prior to Visit  Medication Sig Dispense Refill  . aspirin EC 81 MG tablet Take 1 tablet (81 mg total) by mouth daily. (Patient taking differently: Take 81 mg by mouth every evening. ) 90 tablet 3  . carvedilol (COREG) 6.25 MG tablet Take 1 tablet (6.25 mg total) by mouth 2 (two) times daily. 180 tablet 3  . chlorthalidone (HYGROTON) 25 MG tablet Take 1 tablet (25 mg total) by mouth daily. 90 tablet 1  . clopidogrel (PLAVIX) 75 MG tablet Take 1 tablet (75 mg total) by mouth daily with breakfast. 90 tablet 3  . Crisaborole (EUCRISA) 2 % OINT Apply 1 Act topically 2 (two) times a day. 100 g 5  . irbesartan (AVAPRO) 300 MG tablet Take 1 tablet (300 mg total) by mouth every evening. 90 tablet 1  . levothyroxine (SYNTHROID) 75 MCG tablet Take 1 tablet (75 mcg total) by mouth daily. 90 tablet 1  . nitroGLYCERIN (NITROSTAT) 0.4 MG SL tablet Place 1 tablet (0.4 mg total) under the tongue every 5 (five) minutes as needed. 25 tablet  3   No current facility-administered medications on file prior to visit.     No Known Allergies  Assessment/Plan:  1. Hyperlipidemia - LDL is above goal at 138 (goal < 70). Start Praluent 75 mg [place] injection once every 14 days. Prior authorization has been approved with insurance company. Praluent copay card activated and supplemental billing information called over to pharmacy. Educated and counseled on increasing exercise and provided tips to help increase motivation. Repeat lipids and LFTs in ~2 months after beginning Praluent.   Vertis Kelch, PharmD PGY2 Cardiology Pharmacy Resident [place] [address], [place], [place] [address] Phone: [phone]; Fax: [phone]  [employer] [id number] Site No: [id number] Subject ID No: [id number]     Did subject meet all eligibility criteria?  Code  No Yes  64   Adults age ? 40 years '[]'  '[x]'   One or both of the following:   102 A  Hospitalization for a clinical ASCVD event: acute (MI), UA, IS, or CLI within 18 months of enrollment        Note: Subjects must have been admitted to the hospital. Those       who are admitted and discharged in less than 24 hours are      Eligible for the study.Subjects who have been admitted to     The ER for a clinical ASCVD. '[]'  '[x]'   102B Coronary or peripheral revascularization including percutaneous or surgical revascularization in the past 18 months '[]'  '[x]'   One of the following:  103 A  LDL ? 70 mg/dL (1.81 mmol/L) with no plans for immediate initiation or titration of statin therapy '[]'  '[x]'   103 B Newly started on PCSK9i after the index hospitalization/procedure and prior to enrollment (but no more than 6 months prior to enrollment) with pre-PCSK9i treatment LDL-C value available and measured within 6 months of starting PCSK9i and known background LLT any time prior to PCSK9i initiation. '[x]'  '[]'   105 Planned follow-up within the health system '[]'  '[x]'   Subject does not meet any of the following exclusion criteria   201 Unable or unwilling to provide informed consent, including but not limited to cognitive or language barriers (reading or comprehension) '[x]'  '[]'   202 Lack of phone or email for contact '[x]'  '[]'   203 Evidence of end stage renal disease (ESRD) or stage 5 CKD '[x]'  '[]'   204 Anticipated life expectancy less than 6 month  '[x]'  '[]'   206  On a PCSK9i prior to their qualifying event '[x]'  '[]'      Subject Name: Henry Owen  Subject met inclusion and exclusion criteria.  The informed consent form, study requirements and expectations were reviewed with the subject and questions and concerns were addressed prior to the signing of the consent form.  The subject verbalized understanding of the trial requirements.  The subject agreed to participate in the [place] trial and signed the informed consent at 8:50am on11/23/20.   The informed consent was obtained prior to performance of any protocol-specific procedures for the subject.  A copy of the signed informed consent was given to the subject and a copy was placed in the subject's medical record.   Megan Supple   Socioeconomic Status - Baseline  Education Some High School or Less High School graduate Some College/University Graduated College/University or above '[]'  '[]'  '[x]'  '[]'    Marital Status Married Single Divorced Separated '[]'  '[x]'  '[]'  '[]'    Income <$15,000 $15,001 - $34,999 $35,000 - $74,999 $75,000 - $99,999 >$100,000 '[]'  '[]'  '[]'  '[]'  [  x]

## 2019-12-22 ENCOUNTER — Other Ambulatory Visit: Payer: Self-pay

## 2019-12-22 ENCOUNTER — Other Ambulatory Visit: Payer: 59 | Admitting: *Deleted

## 2019-12-22 DIAGNOSIS — I25118 Atherosclerotic heart disease of native coronary artery with other forms of angina pectoris: Secondary | ICD-10-CM

## 2019-12-22 LAB — HEPATIC FUNCTION PANEL
ALT: 14 IU/L (ref 0–44)
AST: 19 IU/L (ref 0–40)
Albumin: 4.3 g/dL (ref 3.8–4.9)
Alkaline Phosphatase: 62 IU/L (ref 39–117)
Bilirubin Total: 0.6 mg/dL (ref 0.0–1.2)
Bilirubin, Direct: 0.15 mg/dL (ref 0.00–0.40)
Total Protein: 7.4 g/dL (ref 6.0–8.5)

## 2019-12-22 LAB — LIPID PANEL
Chol/HDL Ratio: 3.2 ratio (ref 0.0–5.0)
Cholesterol, Total: 126 mg/dL (ref 100–199)
HDL: 40 mg/dL (ref >39)
LDL Chol Calc (NIH): 58 mg/dL (ref 0–99)
Triglycerides: 167 mg/dL — ABNORMAL HIGH (ref 0–149)
VLDL Cholesterol Cal: 28 mg/dL (ref 5–40)

## 2019-12-24 ENCOUNTER — Other Ambulatory Visit: Payer: Self-pay | Admitting: Internal Medicine

## 2019-12-24 DIAGNOSIS — E039 Hypothyroidism, unspecified: Secondary | ICD-10-CM

## 2019-12-24 DIAGNOSIS — I10 Essential (primary) hypertension: Secondary | ICD-10-CM

## 2019-12-24 DIAGNOSIS — I25118 Atherosclerotic heart disease of native coronary artery with other forms of angina pectoris: Secondary | ICD-10-CM

## 2019-12-25 ENCOUNTER — Encounter: Payer: Self-pay | Admitting: Internal Medicine

## 2019-12-28 ENCOUNTER — Ambulatory Visit (INDEPENDENT_AMBULATORY_CARE_PROVIDER_SITE_OTHER): Payer: 59 | Admitting: Internal Medicine

## 2019-12-28 ENCOUNTER — Other Ambulatory Visit: Payer: Self-pay

## 2019-12-28 ENCOUNTER — Encounter: Payer: Self-pay | Admitting: Internal Medicine

## 2019-12-28 VITALS — BP 144/94 | HR 76 | Temp 98.1°F | Resp 16 | Ht 72.0 in | Wt 219.0 lb

## 2019-12-28 DIAGNOSIS — E039 Hypothyroidism, unspecified: Secondary | ICD-10-CM

## 2019-12-28 DIAGNOSIS — I10 Essential (primary) hypertension: Secondary | ICD-10-CM

## 2019-12-28 DIAGNOSIS — E876 Hypokalemia: Secondary | ICD-10-CM

## 2019-12-28 DIAGNOSIS — L309 Dermatitis, unspecified: Secondary | ICD-10-CM

## 2019-12-28 DIAGNOSIS — I25118 Atherosclerotic heart disease of native coronary artery with other forms of angina pectoris: Secondary | ICD-10-CM | POA: Diagnosis not present

## 2019-12-28 DIAGNOSIS — E559 Vitamin D deficiency, unspecified: Secondary | ICD-10-CM

## 2019-12-28 DIAGNOSIS — N138 Other obstructive and reflux uropathy: Secondary | ICD-10-CM

## 2019-12-28 DIAGNOSIS — T502X5A Adverse effect of carbonic-anhydrase inhibitors, benzothiadiazides and other diuretics, initial encounter: Secondary | ICD-10-CM

## 2019-12-28 DIAGNOSIS — E785 Hyperlipidemia, unspecified: Secondary | ICD-10-CM | POA: Diagnosis not present

## 2019-12-28 DIAGNOSIS — N401 Enlarged prostate with lower urinary tract symptoms: Secondary | ICD-10-CM

## 2019-12-28 DIAGNOSIS — Z0000 Encounter for general adult medical examination without abnormal findings: Secondary | ICD-10-CM

## 2019-12-28 LAB — URINALYSIS, ROUTINE W REFLEX MICROSCOPIC
Bilirubin Urine: NEGATIVE
Hgb urine dipstick: NEGATIVE
Ketones, ur: 15 — AB
Leukocytes,Ua: NEGATIVE
Nitrite: NEGATIVE
RBC / HPF: NONE SEEN (ref 0–?)
Specific Gravity, Urine: 1.02 (ref 1.000–1.030)
Total Protein, Urine: NEGATIVE
Urine Glucose: NEGATIVE
Urobilinogen, UA: 0.2 (ref 0.0–1.0)
pH: 7 (ref 5.0–8.0)

## 2019-12-28 LAB — CBC WITH DIFFERENTIAL/PLATELET
Basophils Absolute: 0.1 10*3/uL (ref 0.0–0.1)
Basophils Relative: 1.1 % (ref 0.0–3.0)
Eosinophils Absolute: 0 10*3/uL (ref 0.0–0.7)
Eosinophils Relative: 0.3 % (ref 0.0–5.0)
HCT: 46.6 % (ref 39.0–52.0)
Hemoglobin: 15.7 g/dL (ref 13.0–17.0)
Lymphocytes Relative: 7.6 % — ABNORMAL LOW (ref 12.0–46.0)
Lymphs Abs: 0.8 10*3/uL (ref 0.7–4.0)
MCHC: 33.8 g/dL (ref 30.0–36.0)
MCV: 93.5 fl (ref 78.0–100.0)
Monocytes Absolute: 0.6 10*3/uL (ref 0.1–1.0)
Monocytes Relative: 5.8 % (ref 3.0–12.0)
Neutro Abs: 8.7 10*3/uL — ABNORMAL HIGH (ref 1.4–7.7)
Neutrophils Relative %: 85.2 % — ABNORMAL HIGH (ref 43.0–77.0)
Platelets: 181 10*3/uL (ref 150.0–400.0)
RBC: 4.98 Mil/uL (ref 4.22–5.81)
RDW: 13.7 % (ref 11.5–15.5)
WBC: 10.3 10*3/uL (ref 4.0–10.5)

## 2019-12-28 LAB — BASIC METABOLIC PANEL
BUN: 14 mg/dL (ref 6–23)
CO2: 31 mEq/L (ref 19–32)
Calcium: 9.7 mg/dL (ref 8.4–10.5)
Chloride: 99 mEq/L (ref 96–112)
Creatinine, Ser: 1.02 mg/dL (ref 0.40–1.50)
GFR: 74.8 mL/min (ref >60.00)
Glucose, Bld: 118 mg/dL — ABNORMAL HIGH (ref 70–99)
Potassium: 3.4 mEq/L — ABNORMAL LOW (ref 3.5–5.1)
Sodium: 137 mEq/L (ref 135–145)

## 2019-12-28 LAB — HEPATIC FUNCTION PANEL
ALT: 15 U/L (ref 0–53)
AST: 18 U/L (ref 0–37)
Albumin: 4.3 g/dL (ref 3.5–5.2)
Alkaline Phosphatase: 58 U/L (ref 39–117)
Bilirubin, Direct: 0.2 mg/dL (ref 0.0–0.3)
Total Bilirubin: 0.9 mg/dL (ref 0.2–1.2)
Total Protein: 7.5 g/dL (ref 6.0–8.3)

## 2019-12-28 LAB — VITAMIN D 25 HYDROXY (VIT D DEFICIENCY, FRACTURES): VITD: 28.05 ng/mL — ABNORMAL LOW (ref 30.00–100.00)

## 2019-12-28 LAB — TSH: TSH: 2.78 u[IU]/mL (ref 0.35–4.50)

## 2019-12-28 LAB — PSA: PSA: 0.29 ng/mL (ref 0.10–4.00)

## 2019-12-28 MED ORDER — EUCRISA 2 % EX OINT: 1.0000 | TOPICAL_OINTMENT | Freq: Two times a day (BID) | CUTANEOUS | 5 refills | Status: DC

## 2019-12-28 MED ORDER — CHLORTHALIDONE 25 MG PO TABS: 25.0000 mg | ORAL_TABLET | Freq: Every day | ORAL | 1 refills | Status: DC

## 2019-12-28 MED ORDER — CARVEDILOL 12.5 MG PO TABS: 12.5000 mg | ORAL_TABLET | Freq: Two times a day (BID) | ORAL | 0 refills | Status: DC

## 2019-12-28 MED ORDER — CLOPIDOGREL BISULFATE 75 MG PO TABS: 75.0000 mg | ORAL_TABLET | Freq: Every day | ORAL | 1 refills | Status: DC

## 2019-12-28 MED ORDER — ASPIRIN EC 81 MG PO TBEC: 81.0000 mg | DELAYED_RELEASE_TABLET | Freq: Every day | ORAL | 1 refills | Status: DC

## 2019-12-28 MED ORDER — CHOLECALCIFEROL 50 MCG (2000 UT) PO TABS: 1.0000 | ORAL_TABLET | Freq: Every day | ORAL | 1 refills | Status: DC

## 2019-12-28 MED ORDER — LEVOTHYROXINE SODIUM 75 MCG PO TABS: 75.0000 ug | ORAL_TABLET | Freq: Every day | ORAL | 1 refills | Status: DC

## 2019-12-28 MED ORDER — POTASSIUM CHLORIDE CRYS ER 20 MEQ PO TBCR: 20.0000 meq | EXTENDED_RELEASE_TABLET | Freq: Two times a day (BID) | ORAL | 1 refills | Status: DC

## 2019-12-28 MED ORDER — IRBESARTAN 300 MG PO TABS: 300.0000 mg | ORAL_TABLET | Freq: Every evening | ORAL | 1 refills | Status: DC

## 2019-12-28 MED ORDER — CARVEDILOL 6.25 MG PO TABS: 6.2500 mg | ORAL_TABLET | Freq: Two times a day (BID) | ORAL | 1 refills | Status: DC

## 2019-12-28 NOTE — Progress Notes (Signed)
Subjective:  Patient ID: Henry Owen, male    DOB: [birth year]  Age: 59 y.o. MRN: [id number]  CC: Annual Exam, Hypertension, and Hypothyroidism  This visit occurred during the SARS-CoV-2 public health emergency.  Safety protocols were in place, including screening questions prior to the visit, additional usage of staff PPE, and extensive cleaning of exam room while observing appropriate contact time as indicated for disinfecting solutions.   HPI Henry Owen presents for a CPX.  He is concerned that his blood pressure is not adequately well controlled.  He has had a few headaches recently but denies blurred vision.  He walks 1 to 2 miles a day and does not experience CP, DOE, palpitations, edema, or fatigue.  Outpatient Medications Prior to Visit  Medication Sig Dispense Refill  . Alirocumab (PRALUENT) 75 MG/ML SOAJ Inject 1 pen into the skin every 14 (fourteen) days. 6 pen 3  . aspirin EC 81 MG tablet Take 1 tablet (81 mg total) by mouth daily. (Patient taking differently: Take 81 mg by mouth every evening. ) 90 tablet 3  . carvedilol (COREG) 6.25 MG tablet Take 1 tablet (6.25 mg total) by mouth 2 (two) times daily. 180 tablet 3  . chlorthalidone (HYGROTON) 25 MG tablet Take 1 tablet (25 mg total) by mouth daily. 90 tablet 1  . clopidogrel (PLAVIX) 75 MG tablet Take 1 tablet (75 mg total) by mouth daily with breakfast. 90 tablet 3  . Crisaborole (EUCRISA) 2 % OINT Apply 1 Act topically 2 (two) times a day. 100 g 5  . irbesartan (AVAPRO) 300 MG tablet Take 1 tablet (300 mg total) by mouth every evening. 90 tablet 1  . levothyroxine (SYNTHROID) 75 MCG tablet Take 1 tablet (75 mcg total) by mouth daily. 90 tablet 1  . nitroGLYCERIN (NITROSTAT) 0.4 MG SL tablet Place 1 tablet (0.4 mg total) under the tongue every 5 (five) minutes as needed. (Patient not taking: Reported on 12/28/2019) 25 tablet 3   No facility-administered medications prior to visit.    ROS Review of Systems    Constitutional: Negative for appetite change, diaphoresis, fatigue and unexpected weight change.  HENT: Negative.   Eyes: Negative for visual disturbance.  Respiratory: Negative for cough, chest tightness, shortness of breath and wheezing.   Cardiovascular: Negative for chest pain, palpitations and leg swelling.  Gastrointestinal: Negative for abdominal pain, constipation, diarrhea, nausea and vomiting.  Endocrine: Negative.  Negative for cold intolerance and heat intolerance.  Genitourinary: Positive for difficulty urinating. Negative for dysuria and frequency.       He complains of a weak urine stream and nocturia.  He does not strain or dribble.  Musculoskeletal: Negative.  Negative for arthralgias and myalgias.  Skin: Positive for rash. Negative for color change and pallor.  Neurological: Positive for headaches. Negative for dizziness, speech difficulty, weakness and light-headedness.  Hematological: Negative for adenopathy. Does not bruise/bleed easily.  Psychiatric/Behavioral: Negative.     Objective:  BP (!) 144/94 (BP Location: Left Arm, Patient Position: Sitting, Cuff Size: Normal)   Pulse 76   Temp 98.1 F (36.7 C) (Oral)   Resp 16   Ht 6' (1.829 m)   Wt 219 lb (99.3 kg)   SpO2 97%   BMI 29.70 kg/m   BP Readings from Last 3 Encounters:  12/28/19 (!) 144/94  11/03/19 118/84  10/26/19 122/82    Wt Readings from Last 3 Encounters:  12/28/19 219 lb (99.3 kg)  11/03/19 223 lb (101.2 kg)  10/26/19 223 lb (  101.2 kg)    Physical Exam Vitals reviewed.  Constitutional:      Appearance: Normal appearance.  HENT:     Nose: Nose normal.     Mouth/Throat:     Mouth: Mucous membranes are moist.  Eyes:     General: No scleral icterus.       Right eye: No discharge.        Left eye: No discharge.     Conjunctiva/sclera: Conjunctivae normal.     Pupils: Pupils are equal, round, and reactive to light.  Cardiovascular:     Rate and Rhythm: Normal rate and regular  rhythm.     Pulses: Normal pulses.     Heart sounds: No murmur.  Pulmonary:     Effort: Pulmonary effort is normal.     Breath sounds: No stridor. No wheezing, rhonchi or rales.  Abdominal:     General: Abdomen is flat. Bowel sounds are normal. There is no distension.     Palpations: Abdomen is soft. There is no hepatomegaly or splenomegaly.     Tenderness: There is no abdominal tenderness.     Hernia: There is no hernia in the left inguinal area or right inguinal area.  Genitourinary:    Pubic Area: No rash.      Penis: Normal.      Testes: Normal.        Right: Mass or tenderness not present.        Left: Mass or tenderness not present.     Epididymis:     Right: Normal.     Left: Normal.     Prostate: Enlarged (1+ smooth symm BPH). Not tender and no nodules present.     Rectum: Normal. Guaiac result negative. No mass, tenderness, anal fissure, external hemorrhoid or internal hemorrhoid. Normal anal tone.  Musculoskeletal:        General: Normal range of motion.     Cervical back: Neck supple.  Lymphadenopathy:     Cervical: No cervical adenopathy.     Lower Body: No right inguinal adenopathy. No left inguinal adenopathy.  Skin:    General: Skin is warm and dry.     Findings: Rash present. Rash is macular and scaling. Rash is not papular, purpuric, pustular, urticarial or vesicular.     Comments: Patches of eczema noted on the LE's and buttocks  Neurological:     General: No focal deficit present.     Mental Status: He is alert and oriented to person, place, and time.  Psychiatric:        Mood and Affect: Mood normal.     Lab Results  Component Value Date   WBC 10.3 12/28/2019   HGB 15.7 12/28/2019   HCT 46.6 12/28/2019   PLT 181.0 12/28/2019   GLUCOSE 118 (H) 12/28/2019   CHOL 126 12/22/2019   TRIG 167 (H) 12/22/2019   HDL 40 12/22/2019   LDLCALC 58 12/22/2019   ALT 15 12/28/2019   AST 18 12/28/2019   NA 137 12/28/2019   K 3.4 (L) 12/28/2019   CL 99  12/28/2019   CREATININE 1.02 12/28/2019   BUN 14 12/28/2019   CO2 31 12/28/2019   TSH 2.78 12/28/2019   PSA 0.29 12/28/2019   HGBA1C 5.1 04/21/2018    No results found.  Assessment & Plan:   Henry Owen was seen today for annual exam, hypertension and hypothyroidism.  Diagnoses and all orders for this visit:  Essential hypertension, benign- His blood pressure is not adequately well controlled.  In addition to improving his lifestyle modifications I have asked him to increase the dose of carvedilol.  He will stay on the current dose of Avapro and chlorthalidone.  He has developed hypokalemia from the thiazide diuretic so I recommended he start taking a potassium supplement. -     Discontinue: carvedilol (COREG) 6.25 MG tablet; Take 1 tablet (6.25 mg total) by mouth 2 (two) times daily. -     chlorthalidone (HYGROTON) 25 MG tablet; Take 1 tablet (25 mg total) by mouth daily. -     irbesartan (AVAPRO) 300 MG tablet; Take 1 tablet (300 mg total) by mouth every evening. -     carvedilol (COREG) 12.5 MG tablet; Take 1 tablet (12.5 mg total) by mouth 2 (two) times daily with a meal. -     CBC with Differential -     Basic metabolic panel -     Urinalysis, Routine w reflex microscopic -     Vitamin D 25 hydroxy  Acquired hypothyroidism- His TSH is in the normal range.  He will remain on the current dose of levothyroxine. -     TSH -     levothyroxine (SYNTHROID) 75 MCG tablet; Take 1 tablet (75 mcg total) by mouth daily.  Routine general medical examination at a health care facility- Exam completed, labs reviewed, he refused a flu vaccine, cancer screenings are up-to-date, patient education material was given. -     PSA  Hyperlipidemia LDL goal <70- He is not willing to take a statin.  He has achieved his LDL goal with a PCSK9 inhibitor. -     Hepatic function panel  Coronary artery disease of native artery of native heart with stable angina pectoris [facility])- He is angina free.  Will continue  to work on risk factor modifications. -     aspirin EC 81 MG tablet; Take 1 tablet (81 mg total) by mouth daily. -     clopidogrel (PLAVIX) 75 MG tablet; Take 1 tablet (75 mg total) by mouth daily with breakfast. -     irbesartan (AVAPRO) 300 MG tablet; Take 1 tablet (300 mg total) by mouth every evening. -     carvedilol (COREG) 12.5 MG tablet; Take 1 tablet (12.5 mg total) by mouth 2 (two) times daily with a meal.  Eczema, unspecified type -     Crisaborole (EUCRISA) 2 % OINT; Apply 1 Act topically 2 (two) times daily.  Vitamin D deficiency -     Cholecalciferol 50 MCG (2000 UT) TABS; Take 1 tablet (2,000 Units total) by mouth daily.  Diuretic-induced hypokalemia -     potassium chloride SA (KLOR-CON) 20 MEQ tablet; Take 1 tablet (20 mEq total) by mouth 2 (two) times daily.  BPH with obstruction/lower urinary tract - His PSA is low which is a reassuring sign that he does not have prostate cancer.  He has minimal symptoms that he does not want to treat.   I have discontinued Kayleen Memos. Nazir's carvedilol and carvedilol. I have also changed his [place]. Additionally, I am having him start on carvedilol, Cholecalciferol, and potassium chloride SA. Lastly, I am having him maintain his nitroGLYCERIN, Praluent, aspirin EC, chlorthalidone, clopidogrel, irbesartan, and levothyroxine.  Meds ordered this encounter  Medications  . aspirin EC 81 MG tablet    Sig: Take 1 tablet (81 mg total) by mouth daily.    Dispense:  90 tablet    Refill:  1  . DISCONTD: carvedilol (COREG) 6.25 MG tablet    Sig:  Take 1 tablet (6.25 mg total) by mouth 2 (two) times daily.    Dispense:  180 tablet    Refill:  1  . chlorthalidone (HYGROTON) 25 MG tablet    Sig: Take 1 tablet (25 mg total) by mouth daily.    Dispense:  90 tablet    Refill:  1  . clopidogrel (PLAVIX) 75 MG tablet    Sig: Take 1 tablet (75 mg total) by mouth daily with breakfast.    Dispense:  90 tablet    Refill:  1    Same day PCI  .  irbesartan (AVAPRO) 300 MG tablet    Sig: Take 1 tablet (300 mg total) by mouth every evening.    Dispense:  90 tablet    Refill:  1  . carvedilol (COREG) 12.5 MG tablet    Sig: Take 1 tablet (12.5 mg total) by mouth 2 (two) times daily with a meal.    Dispense:  180 tablet    Refill:  0  . Crisaborole (EUCRISA) 2 % OINT    Sig: Apply 1 Act topically 2 (two) times daily.    Dispense:  100 g    Refill:  5  . Cholecalciferol 50 MCG (2000 UT) TABS    Sig: Take 1 tablet (2,000 Units total) by mouth daily.    Dispense:  90 tablet    Refill:  1  . potassium chloride SA (KLOR-CON) 20 MEQ tablet    Sig: Take 1 tablet (20 mEq total) by mouth 2 (two) times daily.    Dispense:  180 tablet    Refill:  1  . levothyroxine (SYNTHROID) 75 MCG tablet    Sig: Take 1 tablet (75 mcg total) by mouth daily.    Dispense:  90 tablet    Refill:  1     Follow-up: Return in about 3 months (around 03/27/2020).  [name], MD

## 2019-12-28 NOTE — Patient Instructions (Signed)

## 2020-03-27 ENCOUNTER — Ambulatory Visit (INDEPENDENT_AMBULATORY_CARE_PROVIDER_SITE_OTHER): Payer: 59 | Admitting: Internal Medicine

## 2020-03-27 ENCOUNTER — Encounter: Payer: Self-pay | Admitting: Internal Medicine

## 2020-03-27 ENCOUNTER — Other Ambulatory Visit: Payer: Self-pay

## 2020-03-27 ENCOUNTER — Other Ambulatory Visit: Payer: Self-pay | Admitting: Internal Medicine

## 2020-03-27 VITALS — BP 122/82 | HR 72 | Temp 98.7°F | Resp 16 | Ht 72.0 in | Wt 220.0 lb

## 2020-03-27 DIAGNOSIS — E039 Hypothyroidism, unspecified: Secondary | ICD-10-CM

## 2020-03-27 DIAGNOSIS — E876 Hypokalemia: Secondary | ICD-10-CM

## 2020-03-27 DIAGNOSIS — I10 Essential (primary) hypertension: Secondary | ICD-10-CM | POA: Diagnosis not present

## 2020-03-27 DIAGNOSIS — I25118 Atherosclerotic heart disease of native coronary artery with other forms of angina pectoris: Secondary | ICD-10-CM | POA: Diagnosis not present

## 2020-03-27 DIAGNOSIS — T502X5A Adverse effect of carbonic-anhydrase inhibitors, benzothiadiazides and other diuretics, initial encounter: Secondary | ICD-10-CM

## 2020-03-27 LAB — BASIC METABOLIC PANEL
BUN: 14 mg/dL (ref 6–23)
CO2: 32 mEq/L (ref 19–32)
Calcium: 9.7 mg/dL (ref 8.4–10.5)
Chloride: 98 mEq/L (ref 96–112)
Creatinine, Ser: 1.03 mg/dL (ref 0.40–1.50)
GFR: 73.9 mL/min (ref >60.00)
Glucose, Bld: 109 mg/dL — ABNORMAL HIGH (ref 70–99)
Potassium: 4 mEq/L (ref 3.5–5.1)
Sodium: 137 mEq/L (ref 135–145)

## 2020-03-27 LAB — TSH: TSH: 3.55 u[IU]/mL (ref 0.35–4.50)

## 2020-03-27 NOTE — Patient Instructions (Signed)

## 2020-03-27 NOTE — Progress Notes (Signed)
Subjective:  Patient ID: Henry Owen, male    DOB: Sep 24, 1961  Age: 59 y.o. MRN: [id number]  CC: Hypothyroidism and Hypertension  This visit occurred during the SARS-CoV-2 public health emergency.  Safety protocols were in place, including screening questions prior to the visit, additional usage of staff PPE, and extensive cleaning of exam room while observing appropriate contact time as indicated for disinfecting solutions.    HPI Henry Owen presents for f/up - He continues to complain of chronic, unchanged fatigue but otherwise feels well.  He is active and denies any recent episodes of chest pain, DOE, shortness of breath, diaphoresis, palpitations, or edema.  Outpatient Medications Prior to Visit  Medication Sig Dispense Refill  . Alirocumab (PRALUENT) 75 MG/ML SOAJ Inject 1 pen into the skin every 14 (fourteen) days. 6 pen 3  . aspirin EC 81 MG tablet Take 1 tablet (81 mg total) by mouth daily. 90 tablet 1  . Cholecalciferol 50 MCG (2000 UT) TABS Take 1 tablet (2,000 Units total) by mouth daily. 90 tablet 1  . clopidogrel (PLAVIX) 75 MG tablet Take 1 tablet (75 mg total) by mouth daily with breakfast. 90 tablet 1  . Crisaborole (EUCRISA) 2 % OINT Apply 1 Act topically 2 (two) times daily. 100 g 5  . irbesartan (AVAPRO) 300 MG tablet Take 1 tablet (300 mg total) by mouth every evening. 90 tablet 1  . levothyroxine (SYNTHROID) 75 MCG tablet Take 1 tablet (75 mcg total) by mouth daily. 90 tablet 1  . nitroGLYCERIN (NITROSTAT) 0.4 MG SL tablet Place 1 tablet (0.4 mg total) under the tongue every 5 (five) minutes as needed. 25 tablet 3  . potassium chloride SA (KLOR-CON) 20 MEQ tablet Take 1 tablet (20 mEq total) by mouth 2 (two) times daily. 180 tablet 1  . carvedilol (COREG) 12.5 MG tablet Take 1 tablet (12.5 mg total) by mouth 2 (two) times daily with a meal. 180 tablet 0  . chlorthalidone (HYGROTON) 25 MG tablet Take 1 tablet (25 mg total) by mouth daily. 90 tablet 1   No  facility-administered medications prior to visit.    ROS Review of Systems  Constitutional: Positive for fatigue. Negative for appetite change, diaphoresis, fever and unexpected weight change.  HENT: Negative.  Negative for trouble swallowing.   Eyes: Negative for visual disturbance.  Respiratory: Negative for cough, chest tightness, shortness of breath and wheezing.   Cardiovascular: Negative for chest pain, palpitations and leg swelling.  Gastrointestinal: Negative for abdominal pain, blood in stool, constipation, diarrhea, nausea and vomiting.  Endocrine: Negative for cold intolerance and heat intolerance.  Genitourinary: Negative.  Negative for difficulty urinating.  Musculoskeletal: Negative.  Negative for arthralgias and myalgias.  Skin: Negative.  Negative for color change and pallor.  Neurological: Negative.  Negative for dizziness, weakness, light-headedness and headaches.  Hematological: Negative for adenopathy. Does not bruise/bleed easily.  Psychiatric/Behavioral: Negative.     Objective:  BP 122/82 (BP Location: Left Arm, Patient Position: Sitting, Cuff Size: Large)   Pulse 72   Temp 98.7 F (37.1 C) (Oral)   Resp 16   Ht 6' (1.829 m)   Wt 220 lb (99.8 kg)   SpO2 97%   BMI 29.84 kg/m   BP Readings from Last 3 Encounters:  03/27/20 122/82  12/28/19 (!) 144/94  11/03/19 118/84    Wt Readings from Last 3 Encounters:  03/27/20 220 lb (99.8 kg)  12/28/19 219 lb (99.3 kg)  11/03/19 223 lb (101.2 kg)  Physical Exam Vitals reviewed.  Constitutional:      Appearance: Normal appearance.  HENT:     Nose: Nose normal.     Mouth/Throat:     Mouth: Mucous membranes are moist.  Eyes:     General: No scleral icterus.    Conjunctiva/sclera: Conjunctivae normal.  Cardiovascular:     Rate and Rhythm: Normal rate and regular rhythm.     Heart sounds: No murmur.  Pulmonary:     Effort: Pulmonary effort is normal.     Breath sounds: No wheezing, rhonchi or rales.    Abdominal:     General: Abdomen is flat.     Palpations: There is no mass.     Tenderness: There is no abdominal tenderness. There is no guarding.  Musculoskeletal:        General: Normal range of motion.     Cervical back: Neck supple.     Right lower leg: No edema.     Left lower leg: No edema.  Lymphadenopathy:     Cervical: No cervical adenopathy.  Skin:    General: Skin is warm and dry.     Coloration: Skin is not pale.  Neurological:     General: No focal deficit present.     Mental Status: He is alert.  Psychiatric:        Mood and Affect: Mood normal.        Behavior: Behavior normal.     Lab Results  Component Value Date   WBC 10.3 12/28/2019   HGB 15.7 12/28/2019   HCT 46.6 12/28/2019   PLT 181.0 12/28/2019   GLUCOSE 109 (H) 03/27/2020   CHOL 126 12/22/2019   TRIG 167 (H) 12/22/2019   HDL 40 12/22/2019   LDLCALC 58 12/22/2019   ALT 15 12/28/2019   AST 18 12/28/2019   NA 137 03/27/2020   K 4.0 03/27/2020   CL 98 03/27/2020   CREATININE 1.03 03/27/2020   BUN 14 03/27/2020   CO2 32 03/27/2020   TSH 3.55 03/27/2020   PSA 0.29 12/28/2019   HGBA1C 5.1 04/21/2018    No results found.  Assessment & Plan:   Abhijit was seen today for hypothyroidism and hypertension.  Diagnoses and all orders for this visit:  Essential hypertension, benign- His blood pressure is well controlled.  Electrolytes and renal function are normal. -     Basic metabolic panel; Future -     Basic metabolic panel -     carvedilol (COREG) 12.5 MG tablet; Take 1 tablet (12.5 mg total) by mouth 2 (two) times daily with a meal.  Diuretic-induced hypokalemia -     Basic metabolic panel; Future -     Basic metabolic panel  Acquired hypothyroidism- His TSH is in the normal range.  He will remain on the current dose of levothyroxine. -     TSH; Future -     TSH  Coronary artery disease of native artery of native heart with stable angina pectoris [facility])- He is angina free.  Will  continue to work on risk factor modifications. -     carvedilol (COREG) 12.5 MG tablet; Take 1 tablet (12.5 mg total) by mouth 2 (two) times daily with a meal.   I have discontinued Kayleen Memos. Fils's chlorthalidone. I am also having him maintain his nitroGLYCERIN, Praluent, aspirin EC, clopidogrel, irbesartan, Eucrisa, Cholecalciferol, potassium chloride SA, levothyroxine, and carvedilol.  Meds ordered this encounter  Medications  . carvedilol (COREG) 12.5 MG tablet    Sig:  Take 1 tablet (12.5 mg total) by mouth 2 (two) times daily with a meal.    Dispense:  180 tablet    Refill:  1     Follow-up: Return in about 6 months (around 09/26/2020).  [name], MD

## 2020-03-28 MED ORDER — CARVEDILOL 12.5 MG PO TABS: 12.5000 mg | ORAL_TABLET | Freq: Two times a day (BID) | ORAL | 1 refills | Status: DC

## 2020-04-03 ENCOUNTER — Encounter: Payer: Self-pay | Admitting: Internal Medicine

## 2020-06-22 ENCOUNTER — Other Ambulatory Visit: Payer: Self-pay | Admitting: Internal Medicine

## 2020-06-22 DIAGNOSIS — T502X5A Adverse effect of carbonic-anhydrase inhibitors, benzothiadiazides and other diuretics, initial encounter: Secondary | ICD-10-CM

## 2020-06-22 DIAGNOSIS — I10 Essential (primary) hypertension: Secondary | ICD-10-CM

## 2020-06-22 DIAGNOSIS — I25118 Atherosclerotic heart disease of native coronary artery with other forms of angina pectoris: Secondary | ICD-10-CM

## 2020-06-22 DIAGNOSIS — E039 Hypothyroidism, unspecified: Secondary | ICD-10-CM

## 2020-06-22 MED ORDER — POTASSIUM CHLORIDE CRYS ER 20 MEQ PO TBCR: 20.0000 meq | EXTENDED_RELEASE_TABLET | Freq: Two times a day (BID) | ORAL | 1 refills | Status: DC

## 2020-06-22 MED ORDER — ASPIRIN EC 81 MG PO TBEC: 81.0000 mg | DELAYED_RELEASE_TABLET | Freq: Every day | ORAL | 1 refills | Status: DC

## 2020-06-22 MED ORDER — IRBESARTAN 300 MG PO TABS: 300.0000 mg | ORAL_TABLET | Freq: Every evening | ORAL | 1 refills | Status: DC

## 2020-06-24 ENCOUNTER — Other Ambulatory Visit: Payer: Self-pay | Admitting: Internal Medicine

## 2020-06-24 DIAGNOSIS — I10 Essential (primary) hypertension: Secondary | ICD-10-CM

## 2020-06-24 MED ORDER — CHLORTHALIDONE 25 MG PO TABS: 25.0000 mg | ORAL_TABLET | Freq: Every day | ORAL | 0 refills | Status: DC

## 2020-07-03 ENCOUNTER — Other Ambulatory Visit: Payer: Self-pay | Admitting: Internal Medicine

## 2020-07-03 DIAGNOSIS — E039 Hypothyroidism, unspecified: Secondary | ICD-10-CM

## 2020-08-17 ENCOUNTER — Other Ambulatory Visit: Payer: Self-pay | Admitting: Internal Medicine

## 2020-08-17 DIAGNOSIS — I25118 Atherosclerotic heart disease of native coronary artery with other forms of angina pectoris: Secondary | ICD-10-CM

## 2020-08-17 DIAGNOSIS — I10 Essential (primary) hypertension: Secondary | ICD-10-CM

## 2020-09-14 ENCOUNTER — Other Ambulatory Visit: Payer: Self-pay | Admitting: Cardiovascular Disease

## 2020-09-17 ENCOUNTER — Other Ambulatory Visit: Payer: Self-pay | Admitting: Internal Medicine

## 2020-09-17 DIAGNOSIS — I10 Essential (primary) hypertension: Secondary | ICD-10-CM

## 2020-09-20 ENCOUNTER — Other Ambulatory Visit: Payer: Self-pay | Admitting: Internal Medicine

## 2020-09-20 DIAGNOSIS — I10 Essential (primary) hypertension: Secondary | ICD-10-CM

## 2020-09-20 DIAGNOSIS — I25118 Atherosclerotic heart disease of native coronary artery with other forms of angina pectoris: Secondary | ICD-10-CM

## 2020-09-25 ENCOUNTER — Encounter: Payer: Self-pay | Admitting: Internal Medicine

## 2020-09-25 ENCOUNTER — Other Ambulatory Visit: Payer: Self-pay | Admitting: Internal Medicine

## 2020-09-25 ENCOUNTER — Ambulatory Visit (INDEPENDENT_AMBULATORY_CARE_PROVIDER_SITE_OTHER): Payer: No Typology Code available for payment source | Admitting: Internal Medicine

## 2020-09-25 ENCOUNTER — Other Ambulatory Visit: Payer: Self-pay

## 2020-09-25 VITALS — BP 128/86 | HR 88 | Temp 97.9°F | Resp 16 | Ht 72.0 in | Wt 218.0 lb

## 2020-09-25 DIAGNOSIS — I10 Essential (primary) hypertension: Secondary | ICD-10-CM | POA: Diagnosis not present

## 2020-09-25 DIAGNOSIS — E039 Hypothyroidism, unspecified: Secondary | ICD-10-CM

## 2020-09-25 LAB — BASIC METABOLIC PANEL
BUN: 14 mg/dL (ref 6–23)
CO2: 33 mEq/L — ABNORMAL HIGH (ref 19–32)
Calcium: 10 mg/dL (ref 8.4–10.5)
Chloride: 98 mEq/L (ref 96–112)
Creatinine, Ser: 0.99 mg/dL (ref 0.40–1.50)
GFR: 82.69 mL/min (ref >60.00)
Glucose, Bld: 117 mg/dL — ABNORMAL HIGH (ref 70–99)
Potassium: 3.9 mEq/L (ref 3.5–5.1)
Sodium: 137 mEq/L (ref 135–145)

## 2020-09-25 LAB — TSH: TSH: 2.44 u[IU]/mL (ref 0.35–4.50)

## 2020-09-25 MED ORDER — LEVOTHYROXINE SODIUM 75 MCG PO TABS: 75.0000 ug | ORAL_TABLET | Freq: Every day | ORAL | 1 refills | Status: DC

## 2020-09-25 NOTE — Progress Notes (Signed)
Subjective:  Patient ID: Henry Owen, male    DOB: 08-11-1961  Age: 59 y.o. MRN: [id number]  CC: Hypertension and Hypothyroidism  This visit occurred during the SARS-CoV-2 public health emergency.  Safety protocols were in place, including screening questions prior to the visit, additional usage of staff PPE, and extensive cleaning of exam room while observing appropriate contact time as indicated for disinfecting solutions.    HPI MAX NUNO presents for f/up - He complains of chronic, unchanged fatigue.  He is very active and denies any recent episodes of chest pain, shortness of breath, palpitations, or edema.  He tells me his blood pressure has been well controlled.  Outpatient Medications Prior to Visit  Medication Sig Dispense Refill  . aspirin EC 81 MG tablet Take 1 tablet (81 mg total) by mouth daily. 90 tablet 1  . carvedilol (COREG) 12.5 MG tablet TAKE 1 TABLET (12.5 MG TOTAL) BY MOUTH 2 (TWO) TIMES DAILY WITH A MEAL. 180 tablet 0  . chlorthalidone (HYGROTON) 25 MG tablet TAKE 1 TABLET BY MOUTH EVERY DAY 90 tablet 0  . Cholecalciferol 50 MCG (2000 UT) TABS Take 1 tablet (2,000 Units total) by mouth daily. 90 tablet 1  . clopidogrel (PLAVIX) 75 MG tablet Take 1 tablet (75 mg total) by mouth daily with breakfast. 90 tablet 1  . Crisaborole (EUCRISA) 2 % OINT Apply 1 Act topically 2 (two) times daily. 100 g 5  . irbesartan (AVAPRO) 300 MG tablet TAKE 1 TABLET BY MOUTH EVERY EVENING. 90 tablet 0  . nitroGLYCERIN (NITROSTAT) 0.4 MG SL tablet Place 1 tablet (0.4 mg total) under the tongue every 5 (five) minutes as needed. 25 tablet 3  . potassium chloride SA (KLOR-CON) 20 MEQ tablet Take 1 tablet (20 mEq total) by mouth 2 (two) times daily. 180 tablet 1  . PRALUENT 75 MG/ML SOAJ INJECT 1 PEN INTO THE SKIN EVERY 14 DAYS. 3 mL 3  . levothyroxine (SYNTHROID) 75 MCG tablet TAKE 1 TABLET BY MOUTH EVERY DAY 90 tablet 0   No facility-administered medications prior to visit.     ROS Review of Systems  Constitutional: Positive for fatigue. Negative for appetite change, diaphoresis and unexpected weight change.  HENT: Negative.   Eyes: Negative.   Respiratory: Negative for cough, chest tightness, shortness of breath and wheezing.   Cardiovascular: Negative for chest pain, palpitations and leg swelling.  Gastrointestinal: Negative for abdominal pain, constipation, diarrhea, nausea and vomiting.  Endocrine: Negative.  Negative for cold intolerance and heat intolerance.  Genitourinary: Negative.  Negative for difficulty urinating.  Musculoskeletal: Negative for arthralgias and myalgias.  Skin: Negative.  Negative for color change and pallor.  Neurological: Negative.  Negative for dizziness, weakness and light-headedness.  Hematological: Negative for adenopathy. Does not bruise/bleed easily.  Psychiatric/Behavioral: Negative.     Objective:  BP 128/86   Pulse 88   Temp 97.9 F (36.6 C) (Oral)   Resp 16   Ht 6' (1.829 m)   Wt 218 lb (98.9 kg)   SpO2 97%   BMI 29.57 kg/m   BP Readings from Last 3 Encounters:  09/25/20 128/86  03/27/20 122/82  12/28/19 (!) 144/94    Wt Readings from Last 3 Encounters:  09/25/20 218 lb (98.9 kg)  03/27/20 220 lb (99.8 kg)  12/28/19 219 lb (99.3 kg)    Physical Exam Vitals reviewed.  HENT:     Nose: Nose normal.     Mouth/Throat:     Mouth: Mucous membranes are moist.  Eyes:     General: No scleral icterus.    Conjunctiva/sclera: Conjunctivae normal.  Cardiovascular:     Rate and Rhythm: Normal rate and regular rhythm.     Heart sounds: No murmur heard.   Pulmonary:     Effort: Pulmonary effort is normal.     Breath sounds: No stridor. No wheezing, rhonchi or rales.  Abdominal:     General: Abdomen is flat. Bowel sounds are normal. There is no distension.     Palpations: Abdomen is soft. There is no hepatomegaly, splenomegaly or mass.     Tenderness: There is no abdominal tenderness.  Musculoskeletal:         General: Normal range of motion.     Cervical back: Neck supple.     Right lower leg: No edema.     Left lower leg: No edema.  Lymphadenopathy:     Cervical: No cervical adenopathy.  Skin:    General: Skin is warm and dry.  Neurological:     General: No focal deficit present.     Mental Status: He is alert.  Psychiatric:        Mood and Affect: Mood normal.     Lab Results  Component Value Date   WBC 10.3 12/28/2019   HGB 15.7 12/28/2019   HCT 46.6 12/28/2019   PLT 181.0 12/28/2019   GLUCOSE 117 (H) 09/25/2020   CHOL 126 12/22/2019   TRIG 167 (H) 12/22/2019   HDL 40 12/22/2019   LDLCALC 58 12/22/2019   ALT 15 12/28/2019   AST 18 12/28/2019   NA 137 09/25/2020   K 3.9 09/25/2020   CL 98 09/25/2020   CREATININE 0.99 09/25/2020   BUN 14 09/25/2020   CO2 33 (H) 09/25/2020   TSH 2.44 09/25/2020   PSA 0.29 12/28/2019   HGBA1C 5.1 04/21/2018    No results found.  Assessment & Plan:   Jeovanny was seen today for hypertension and hypothyroidism.  Diagnoses and all orders for this visit:  Essential hypertension, benign- His blood pressure is well controlled.  Electrolytes and renal function are normal.  Will continue the combination of an ARB and thiazide diuretic. -     BASIC METABOLIC PANEL WITH GFR; Future -     Basic Metabolic Panel (BMET)  Acquired hypothyroidism- His TSH is in the normal range.  He will stay on the current dose of levothyroxine. -     TSH; Future -     TSH -     levothyroxine (SYNTHROID) 75 MCG tablet; Take 1 tablet (75 mcg total) by mouth daily.   I have changed Kayleen Memos. Virts's levothyroxine. I am also having him maintain his nitroGLYCERIN, clopidogrel, Eucrisa, Cholecalciferol, potassium chloride SA, aspirin EC, irbesartan, Praluent, chlorthalidone, and carvedilol.  Meds ordered this encounter  Medications  . levothyroxine (SYNTHROID) 75 MCG tablet    Sig: Take 1 tablet (75 mcg total) by mouth daily.    Dispense:  90 tablet     Refill:  1    DX Code Needed  .     Follow-up: No follow-ups on file.  [name], MD

## 2020-10-11 ENCOUNTER — Other Ambulatory Visit: Payer: Self-pay | Admitting: Internal Medicine

## 2020-10-11 DIAGNOSIS — I25118 Atherosclerotic heart disease of native coronary artery with other forms of angina pectoris: Secondary | ICD-10-CM

## 2020-10-15 ENCOUNTER — Other Ambulatory Visit: Payer: Self-pay | Admitting: Cardiovascular Disease

## 2020-10-29 NOTE — Progress Notes (Signed)
Cardiology Office Note   Date:  11/05/2020   ID:  Henry Owen, DOB 02-05-1961, MRN [id number]  PCP:  Janith Lima, MD  Cardiologist:   Jenkins Rouge, MD   No chief complaint on file.     History of Present Illness:  59 y.o. f/u CAD. Previous smoker,, HTN. SSCP atypical Cardiac CT done 12/22/18 with calcium score 304 90 th percentile Significant CAD with positive FFR CT worse in mid LAD CAth 01/20/19 resulted in DES to proximal LAD and OM2  Had residual 85% lesion in D1 small for intervention and residual distal LAD disease Left dominant  Circulation D/C with ASA/Plavix and statin   Has new primary Henry Owen Works  for [employer] from home last 15 years  Has 3 cats  Coreg started on 02/06/19 not on nitrates or Ranexa   He has recurrent Eczema particularly over right calf. Started on Eucrisa ointment by primary Dr Milus Banister increased for HTN   Will not get flu shot or COVID vaccine Discussed importance  No angina Intolerant to statins started on Praluent 09/18/19  Nice drop in LDL 138->58   Has new nitro   Past Medical History:  Diagnosis Date  . BPH with obstruction/lower urinary tract symptoms   . CAD (coronary artery disease), native coronary artery 01/20/2019   cath a. 90% OM 2 S/p DES; 70% pLAD (FFR 0.77) s/p DES; 85% 1st dig - not good target  . Enlarged prostate   . Hypertension   . Hypothyroidism   . Tobacco use   . Warts, genital     Past Surgical History:  Procedure Laterality Date  . CORONARY STENT INTERVENTION N/A 01/20/2019   Procedure: CORONARY STENT INTERVENTION;  Surgeon: Leonie Man, MD;  Location: [facility];  Service: Cardiovascular;  Laterality: N/A;  . INTRAVASCULAR PRESSURE WIRE/FFR STUDY N/A 01/20/2019   Procedure: INTRAVASCULAR PRESSURE WIRE/FFR STUDY;  Surgeon: Leonie Man, MD;  Location: [place];  Service: Cardiovascular;  Laterality: N/A;  . KNEE ARTHROSCOPY  2000  . LASIK  2005  . LEFT HEART CATH AND  CORONARY ANGIOGRAPHY N/A 01/20/2019   Procedure: LEFT HEART CATH AND CORONARY ANGIOGRAPHY;  Surgeon: Leonie Man, MD;  Location: [facility];  Service: Cardiovascular;  Laterality: N/A;  . WISDOM TOOTH EXTRACTION       Current Outpatient Medications  Medication Sig Dispense Refill  . aspirin EC 81 MG tablet Take 1 tablet (81 mg total) by mouth daily. 90 tablet 1  . carvedilol (COREG) 12.5 MG tablet TAKE 1 TABLET (12.5 MG TOTAL) BY MOUTH 2 (TWO) TIMES DAILY WITH A MEAL. 180 tablet 0  . chlorthalidone (HYGROTON) 25 MG tablet TAKE 1 TABLET BY MOUTH EVERY DAY 90 tablet 0  . Cholecalciferol 50 MCG (2000 UT) TABS Take 1 tablet (2,000 Units total) by mouth daily. 90 tablet 1  . clopidogrel (PLAVIX) 75 MG tablet TAKE 1 TABLET (75 MG TOTAL) BY MOUTH DAILY WITH BREAKFAST. 90 tablet 1  . Crisaborole (EUCRISA) 2 % OINT Apply 1 Act topically 2 (two) times daily. 100 g 5  . irbesartan (AVAPRO) 300 MG tablet TAKE 1 TABLET BY MOUTH EVERY EVENING. 90 tablet 0  . levothyroxine (SYNTHROID) 75 MCG tablet Take 1 tablet (75 mcg total) by mouth daily. 90 tablet 1  . nitroGLYCERIN (NITROSTAT) 0.4 MG SL tablet PLACE 1 TABLET (0.4 MG TOTAL) UNDER THE TONGUE EVERY 5 (FIVE) MINUTES AS NEEDED. 25 tablet 0  .  PRALUENT 75 MG/ML SOAJ INJECT 1 PEN INTO THE SKIN EVERY 14 DAYS. 3 mL 3   No current facility-administered medications for this visit.    Allergies:   Patient has no known allergies.    Social History:  The patient  reports that he quit smoking about 2 years ago. His smoking use included cigarettes. He has a 37.50 pack-year smoking history. He has never used smokeless tobacco. He reports that he does not drink alcohol and does not use drugs.   Family History:  The patient's family history includes Diabetes in his mother; Early death in an other family member; Heart disease in his father; Hypertension in his father.    ROS:  Please see the history of present illness.   Otherwise, review of systems are  positive for none.   All other systems are reviewed and negative.    PHYSICAL EXAM: VS:  BP 126/78   Pulse 68   Ht 6' (1.829 m)   Wt 99.3 kg   SpO2 97%   BMI 29.70 kg/m  , BMI Body mass index is 29.7 kg/m. Affect appropriate Healthy:  appears stated age 27: normal Neck supple with no adenopathy JVP normal no bruits no thyromegaly Lungs clear with no wheezing and good diaphragmatic motion Heart:  S1/S2 no murmur, no rub, gallop or click PMI normal Abdomen: benighn, BS positve, no tenderness, no AAA no bruit.  No HSM or HJR Distal pulses intact with no bruits No edema Neuro non-focal Skin erythema/eczema on right forearm and calf  No muscular weakness    EKG:  SR LAD tall R in V1  10/06/18     Recent Labs: 12/28/2019: ALT 15; Hemoglobin 15.7; Platelets 181.0 09/25/2020: BUN 14; Creatinine, Ser 0.99; Potassium 3.9; Sodium 137; TSH 2.44    Lipid Panel    Component Value Date/Time   CHOL 126 12/22/2019 0735   TRIG 167 (H) 12/22/2019 0735   HDL 40 12/22/2019 0735   CHOLHDL 3.2 12/22/2019 0735   CHOLHDL 5.3 (H) 09/22/2016 0739   VLDL 36 (H) 09/22/2016 0739   LDLCALC 58 12/22/2019 0735      Wt Readings from Last 3 Encounters:  11/05/20 99.3 kg  09/25/20 98.9 kg  03/27/20 99.8 kg       Other studies Reviewed: Additional studies/ records that were reviewed today include: Notes cardiology Dr Stanford Breed 2012 Stress Echo, notes Dr Ronnald Ramp primary ECG and labs . Cardiac CT 03/13/19  Cath/PCI 01/20/19     ASSESSMENT AND PLAN:  1.  CAD post DES to proximal LAD and OM2 with residual small D1 disease 01/20/19 Ellyn Hack EF normal  Coreg 6.25 bid  P2Y 122 10/26/19 < 180 showing Rx effect of plavix    2. HTN:  Well controlled.  Continue current medications and low sodium Dash type diet.   3. Smoking congratulated him on cessation lung windows on cardiac CT  12/22/18 ok 4. HLD: at target <70 on praluent labs with primary  5. Thyroid:  On synthroid labs with primary TSH normal  12/28/19    Current medicines are reviewed at length with the patient today.  The patient does not have concerns regarding medicines.  The following changes have been made: none   Labs/ tests ordered today include:  None    Disposition:   FU in6 months     Signed, Jenkins Rouge, MD  11/05/2020 1:31 PM    [facility] [place], [place], [place]  [address] Phone: [phone]; Fax: [phone]  

## 2020-11-05 ENCOUNTER — Other Ambulatory Visit: Payer: Self-pay

## 2020-11-05 ENCOUNTER — Encounter: Payer: Self-pay | Admitting: Cardiovascular Disease

## 2020-11-05 ENCOUNTER — Ambulatory Visit (INDEPENDENT_AMBULATORY_CARE_PROVIDER_SITE_OTHER): Payer: No Typology Code available for payment source | Admitting: Cardiovascular Disease

## 2020-11-05 VITALS — BP 126/78 | HR 68 | Ht 72.0 in | Wt 219.0 lb

## 2020-11-05 DIAGNOSIS — I10 Essential (primary) hypertension: Secondary | ICD-10-CM | POA: Diagnosis not present

## 2020-11-05 DIAGNOSIS — I2510 Atherosclerotic heart disease of native coronary artery without angina pectoris: Secondary | ICD-10-CM | POA: Diagnosis not present

## 2020-11-05 NOTE — Patient Instructions (Signed)
Medication Instructions:  Your physician recommends that you continue on your current medications as directed. Please refer to the Current Medication list given to you today.  *If you need a refill on your cardiac medications before your next appointment, please call your pharmacy*   Lab Work: None today If you have labs (blood work) drawn today and your tests are completely normal, you will receive your results only by: . MyChart Message (if you have MyChart) OR . A paper copy in the mail If you have any lab test that is abnormal or we need to change your treatment, we will call you to review the results.   Testing/Procedures: None today   Follow-Up: At [facility], you and your health needs are our priority.  As part of our continuing mission to provide you with exceptional heart care, we have created designated Provider Care Teams.  These Care Teams include your primary Cardiologist (physician) and Advanced Practice Providers (APPs -  Physician Assistants and Nurse Practitioners) who all work together to provide you with the care you need, when you need it.  We recommend signing up for the patient portal called "MyChart".  Sign up information is provided on this After Visit Summary.  MyChart is used to connect with patients for Virtual Visits (Telemedicine).  Patients are able to view lab/test results, encounter notes, upcoming appointments, etc.  Non-urgent messages can be sent to your provider as well.   To learn more about what you can do with MyChart, go to [email or web address].    Your next appointment:   6 month(s)  The format for your next appointment:   In Person  Provider:   Peter Nishan, MD   Other Instructions None      Thank you for choosing [facility] !         

## 2020-11-10 ENCOUNTER — Other Ambulatory Visit: Payer: Self-pay | Admitting: Internal Medicine

## 2020-11-10 DIAGNOSIS — I10 Essential (primary) hypertension: Secondary | ICD-10-CM

## 2020-11-10 DIAGNOSIS — I25118 Atherosclerotic heart disease of native coronary artery with other forms of angina pectoris: Secondary | ICD-10-CM

## 2020-12-15 ENCOUNTER — Other Ambulatory Visit: Payer: Self-pay | Admitting: Internal Medicine

## 2020-12-15 DIAGNOSIS — I10 Essential (primary) hypertension: Secondary | ICD-10-CM

## 2020-12-15 DIAGNOSIS — I25118 Atherosclerotic heart disease of native coronary artery with other forms of angina pectoris: Secondary | ICD-10-CM

## 2020-12-19 ENCOUNTER — Other Ambulatory Visit: Payer: Self-pay | Admitting: Internal Medicine

## 2020-12-19 DIAGNOSIS — I25118 Atherosclerotic heart disease of native coronary artery with other forms of angina pectoris: Secondary | ICD-10-CM

## 2021-03-03 ENCOUNTER — Encounter: Payer: Self-pay | Admitting: Internal Medicine

## 2021-03-03 ENCOUNTER — Ambulatory Visit (INDEPENDENT_AMBULATORY_CARE_PROVIDER_SITE_OTHER): Payer: No Typology Code available for payment source | Admitting: Internal Medicine

## 2021-03-03 ENCOUNTER — Other Ambulatory Visit: Payer: Self-pay

## 2021-03-03 VITALS — BP 138/84 | HR 71 | Temp 98.6°F | Resp 16 | Ht 72.0 in | Wt 213.0 lb

## 2021-03-03 DIAGNOSIS — N401 Enlarged prostate with lower urinary tract symptoms: Secondary | ICD-10-CM | POA: Diagnosis not present

## 2021-03-03 DIAGNOSIS — I10 Essential (primary) hypertension: Secondary | ICD-10-CM | POA: Diagnosis not present

## 2021-03-03 DIAGNOSIS — E039 Hypothyroidism, unspecified: Secondary | ICD-10-CM

## 2021-03-03 DIAGNOSIS — E785 Hyperlipidemia, unspecified: Secondary | ICD-10-CM

## 2021-03-03 DIAGNOSIS — Z0000 Encounter for general adult medical examination without abnormal findings: Secondary | ICD-10-CM | POA: Diagnosis not present

## 2021-03-03 DIAGNOSIS — T502X5A Adverse effect of carbonic-anhydrase inhibitors, benzothiadiazides and other diuretics, initial encounter: Secondary | ICD-10-CM | POA: Diagnosis not present

## 2021-03-03 DIAGNOSIS — R739 Hyperglycemia, unspecified: Secondary | ICD-10-CM

## 2021-03-03 DIAGNOSIS — N138 Other obstructive and reflux uropathy: Secondary | ICD-10-CM | POA: Diagnosis not present

## 2021-03-03 DIAGNOSIS — E876 Hypokalemia: Secondary | ICD-10-CM | POA: Diagnosis not present

## 2021-03-03 DIAGNOSIS — Z125 Encounter for screening for malignant neoplasm of prostate: Secondary | ICD-10-CM

## 2021-03-03 DIAGNOSIS — E559 Vitamin D deficiency, unspecified: Secondary | ICD-10-CM

## 2021-03-03 LAB — URINALYSIS, ROUTINE W REFLEX MICROSCOPIC
Bilirubin Urine: NEGATIVE
Hgb urine dipstick: NEGATIVE
Ketones, ur: NEGATIVE
Leukocytes,Ua: NEGATIVE
Nitrite: NEGATIVE
RBC / HPF: NONE SEEN (ref 0–?)
Specific Gravity, Urine: 1.015 (ref 1.000–1.030)
Total Protein, Urine: NEGATIVE
Urine Glucose: NEGATIVE
Urobilinogen, UA: 1 (ref 0.0–1.0)
pH: 7 (ref 5.0–8.0)

## 2021-03-03 LAB — CBC WITH DIFFERENTIAL/PLATELET
Basophils Absolute: 0.1 10*3/uL (ref 0.0–0.1)
Basophils Relative: 0.8 % (ref 0.0–3.0)
Eosinophils Absolute: 0.1 10*3/uL (ref 0.0–0.7)
Eosinophils Relative: 0.7 % (ref 0.0–5.0)
HCT: 46.4 % (ref 39.0–52.0)
Hemoglobin: 15.9 g/dL (ref 13.0–17.0)
Lymphocytes Relative: 9.4 % — ABNORMAL LOW (ref 12.0–46.0)
Lymphs Abs: 1 10*3/uL (ref 0.7–4.0)
MCHC: 34.2 g/dL (ref 30.0–36.0)
MCV: 91.9 fl (ref 78.0–100.0)
Monocytes Absolute: 0.9 10*3/uL (ref 0.1–1.0)
Monocytes Relative: 8.1 % (ref 3.0–12.0)
Neutro Abs: 8.9 10*3/uL — ABNORMAL HIGH (ref 1.4–7.7)
Neutrophils Relative %: 81 % — ABNORMAL HIGH (ref 43.0–77.0)
Platelets: 193 10*3/uL (ref 150.0–400.0)
RBC: 5.05 Mil/uL (ref 4.22–5.81)
RDW: 12.9 % (ref 11.5–15.5)
WBC: 11 10*3/uL — ABNORMAL HIGH (ref 4.0–10.5)

## 2021-03-03 LAB — HEPATIC FUNCTION PANEL
ALT: 16 U/L (ref 0–53)
AST: 19 U/L (ref 0–37)
Albumin: 4.7 g/dL (ref 3.5–5.2)
Alkaline Phosphatase: 43 U/L (ref 39–117)
Bilirubin, Direct: 0.2 mg/dL (ref 0.0–0.3)
Total Bilirubin: 1 mg/dL (ref 0.2–1.2)
Total Protein: 7.6 g/dL (ref 6.0–8.3)

## 2021-03-03 LAB — BASIC METABOLIC PANEL
BUN: 14 mg/dL (ref 6–23)
CO2: 31 mEq/L (ref 19–32)
Calcium: 9.9 mg/dL (ref 8.4–10.5)
Chloride: 98 mEq/L (ref 96–112)
Creatinine, Ser: 1.02 mg/dL (ref 0.40–1.50)
GFR: 80.16 mL/min (ref >60.00)
Glucose, Bld: 117 mg/dL — ABNORMAL HIGH (ref 70–99)
Potassium: 3.9 mEq/L (ref 3.5–5.1)
Sodium: 137 mEq/L (ref 135–145)

## 2021-03-03 LAB — HEMOGLOBIN A1C: Hgb A1c MFr Bld: 5.2 % (ref 4.6–6.5)

## 2021-03-03 LAB — VITAMIN D 25 HYDROXY (VIT D DEFICIENCY, FRACTURES): VITD: 46.57 ng/mL (ref 30.00–100.00)

## 2021-03-03 LAB — LIPID PANEL
Cholesterol: 110 mg/dL (ref 0–200)
HDL: 43.7 mg/dL (ref >39.00)
LDL Cholesterol: 47 mg/dL (ref 0–99)
NonHDL: 66.54
Total CHOL/HDL Ratio: 3
Triglycerides: 99 mg/dL (ref 0.0–149.0)
VLDL: 19.8 mg/dL (ref 0.0–40.0)

## 2021-03-03 LAB — TSH: TSH: 2.73 u[IU]/mL (ref 0.35–4.50)

## 2021-03-03 LAB — PSA: PSA: 0.29 ng/mL (ref 0.10–4.00)

## 2021-03-03 NOTE — Progress Notes (Signed)
Subjective:  Patient ID: Henry Owen, male    DOB: January 24, 1961  Age: 60 y.o. MRN: [id number]  CC: Annual Exam, Coronary Artery Disease, Hypertension, Hyperlipidemia, and Hypothyroidism  This visit occurred during the SARS-CoV-2 public health emergency.  Safety protocols were in place, including screening questions prior to the visit, additional usage of staff PPE, and extensive cleaning of exam room while observing appropriate contact time as indicated for disinfecting solutions.    HPI Henry Owen presents for a CPX.  He tells me his blood pressure has been well controlled.  He is active and denies any recent episodes of chest pain, shortness of breath, palpitations, edema, or fatigue.  He has mild nocturia and weak urinary stream.  He does not want to take a medication to treat this.  Outpatient Medications Prior to Visit  Medication Sig Dispense Refill  . ASPIRIN LOW DOSE 81 MG EC tablet TAKE 1 TABLET BY MOUTH EVERY DAY 90 tablet 1  . carvedilol (COREG) 12.5 MG tablet TAKE 1 TABLET (12.5 MG TOTAL) BY MOUTH 2 (TWO) TIMES DAILY WITH A MEAL. 180 tablet 0  . chlorthalidone (HYGROTON) 25 MG tablet TAKE 1 TABLET BY MOUTH EVERY DAY 90 tablet 0  . Cholecalciferol 50 MCG (2000 UT) TABS Take 1 tablet (2,000 Units total) by mouth daily. 90 tablet 1  . clopidogrel (PLAVIX) 75 MG tablet TAKE 1 TABLET (75 MG TOTAL) BY MOUTH DAILY WITH BREAKFAST. 90 tablet 1  . Crisaborole (EUCRISA) 2 % OINT Apply 1 Act topically 2 (two) times daily. 100 g 5  . irbesartan (AVAPRO) 300 MG tablet TAKE 1 TABLET BY MOUTH EVERY DAY IN THE EVENING 90 tablet 1  . levothyroxine (SYNTHROID) 75 MCG tablet Take 1 tablet (75 mcg total) by mouth daily. 90 tablet 1  . nitroGLYCERIN (NITROSTAT) 0.4 MG SL tablet PLACE 1 TABLET (0.4 MG TOTAL) UNDER THE TONGUE EVERY 5 (FIVE) MINUTES AS NEEDED. 25 tablet 0  . PRALUENT 75 MG/ML SOAJ INJECT 1 PEN INTO THE SKIN EVERY 14 DAYS. 3 mL 3   No facility-administered medications prior to  visit.    ROS Review of Systems  Constitutional: Negative for appetite change, chills, diaphoresis, fatigue and fever.  HENT: Negative.   Eyes: Negative.   Respiratory: Negative for cough, chest tightness, shortness of breath and wheezing.   Cardiovascular: Negative for chest pain, palpitations and leg swelling.  Gastrointestinal: Negative for abdominal pain, constipation, diarrhea, nausea and vomiting.  Endocrine: Positive for cold intolerance. Negative for heat intolerance.  Genitourinary: Positive for difficulty urinating. Negative for dysuria, frequency, hematuria, penile swelling, scrotal swelling, testicular pain and urgency.  Musculoskeletal: Negative.  Negative for arthralgias, back pain and myalgias.  Skin: Negative.  Negative for color change and pallor.  Neurological: Negative.  Negative for dizziness, weakness and light-headedness.  Hematological: Negative for adenopathy. Does not bruise/bleed easily.  Psychiatric/Behavioral: Negative.     Objective:  BP 138/84 (BP Location: Left Arm, Patient Position: Sitting, Cuff Size: Large)   Pulse 71   Temp 98.6 F (37 C) (Oral)   Resp 16   Ht 6' (1.829 m)   Wt 213 lb (96.6 kg)   SpO2 97%   BMI 28.89 kg/m   BP Readings from Last 3 Encounters:  03/03/21 138/84  11/05/20 126/78  09/25/20 128/86    Wt Readings from Last 3 Encounters:  03/03/21 213 lb (96.6 kg)  11/05/20 219 lb (99.3 kg)  09/25/20 218 lb (98.9 kg)    Physical Exam Vitals reviewed.  HENT:     Nose: Nose normal.     Mouth/Throat:     Mouth: Mucous membranes are moist.  Eyes:     General: No scleral icterus.    Conjunctiva/sclera: Conjunctivae normal.  Cardiovascular:     Rate and Rhythm: Normal rate and regular rhythm.     Heart sounds: No murmur heard.   Pulmonary:     Effort: Pulmonary effort is normal.     Breath sounds: No stridor. No wheezing, rhonchi or rales.  Abdominal:     General: Abdomen is flat.     Palpations: There is no mass.      Tenderness: There is no abdominal tenderness. There is no guarding.     Hernia: There is no hernia in the left inguinal area or right inguinal area.  Genitourinary:    Pubic Area: No rash.      Penis: Normal and circumcised.      Testes: Normal.     Epididymis:     Right: Normal.     Left: Normal.     Prostate: Enlarged (1+ BPH symm). Not tender and no nodules present.     Rectum: Normal. Guaiac result negative. No mass, tenderness, anal fissure, external hemorrhoid or internal hemorrhoid. Normal anal tone.  Musculoskeletal:        General: Normal range of motion.     Cervical back: Neck supple.     Right lower leg: No edema.     Left lower leg: No edema.  Lymphadenopathy:     Cervical: No cervical adenopathy.     Lower Body: No right inguinal adenopathy. No left inguinal adenopathy.  Skin:    General: Skin is warm and dry.  Neurological:     General: No focal deficit present.  Psychiatric:        Mood and Affect: Mood normal.        Behavior: Behavior normal.     Lab Results  Component Value Date   WBC 11.0 (H) 03/03/2021   HGB 15.9 03/03/2021   HCT 46.4 03/03/2021   PLT 193.0 03/03/2021   GLUCOSE 117 (H) 03/03/2021   CHOL 110 03/03/2021   TRIG 99.0 03/03/2021   HDL 43.70 03/03/2021   LDLCALC 47 03/03/2021   ALT 16 03/03/2021   AST 19 03/03/2021   NA 137 03/03/2021   K 3.9 03/03/2021   CL 98 03/03/2021   CREATININE 1.02 03/03/2021   BUN 14 03/03/2021   CO2 31 03/03/2021   TSH 2.73 03/03/2021   PSA 0.29 03/03/2021   HGBA1C 5.2 03/03/2021    No results found.  Assessment & Plan:   Henry Owen was seen today for annual exam, coronary artery disease, hypertension, hyperlipidemia and hypothyroidism.  Diagnoses and all orders for this visit:  Essential hypertension, benign-his blood pressure is adequately well controlled. -     CBC with Differential/Platelet; Future -     Basic metabolic panel; Future -     Urinalysis, Routine w reflex microscopic;  Future -     Urinalysis, Routine w reflex microscopic -     Basic metabolic panel -     CBC with Differential/Platelet  Acquired hypothyroidism- His TSH is in the normal range.  He will stay on the current dose of levothyroxine. -     TSH; Future -     TSH  Diuretic-induced hypokalemia- His potassium level is normal now. -     Basic metabolic panel; Future -     Basic metabolic panel  Hyperlipidemia  LDL goal <70- He has achieved his LDL goal and is doing well on the statin. -     Hepatic function panel; Future -     Hepatic function panel  Routine general medical examination at a health care facility- Exam completed, labs reviewed, vaccines reviewed-she refused a flu vaccine, cancer screenings are up-to-date, patient education was given. -     Lipid panel; Future -     PSA; Future -     PSA -     Lipid panel  Vitamin D deficiency- His vitamin D level is normal now. -     VITAMIN D 25 Hydroxy (Vit-D Deficiency, Fractures); Future -     VITAMIN D 25 Hydroxy (Vit-D Deficiency, Fractures)  Chronic hyperglycemia- His A1c is normal at 5.2%. -     Basic metabolic panel; Future -     Hemoglobin A1c; Future -     Hemoglobin A1c -     Basic metabolic panel  BPH with obstruction/lower urinary tract symptoms- His PSA is low which is a reassuring sign that he does not have prostate cancer.  He has minimal symptoms that he wishes not to treat. -     Urinalysis, Routine w reflex microscopic; Future -     Urinalysis, Routine w reflex microscopic   I am having Henry Bison "Merry Proud" maintain his Eucrisa, Cholecalciferol, Praluent, levothyroxine, clopidogrel, nitroGLYCERIN, irbesartan, chlorthalidone, carvedilol, and Aspirin Low Dose.  No orders of the defined types were placed in this encounter.    Follow-up: Return in about 6 months (around 09/03/2021).  [name], MD

## 2021-03-03 NOTE — Patient Instructions (Signed)

## 2021-03-10 ENCOUNTER — Other Ambulatory Visit: Payer: Self-pay | Admitting: Internal Medicine

## 2021-03-10 DIAGNOSIS — I10 Essential (primary) hypertension: Secondary | ICD-10-CM

## 2021-03-10 DIAGNOSIS — I25118 Atherosclerotic heart disease of native coronary artery with other forms of angina pectoris: Secondary | ICD-10-CM

## 2021-03-26 ENCOUNTER — Ambulatory Visit: Payer: No Typology Code available for payment source | Admitting: Internal Medicine

## 2021-04-10 ENCOUNTER — Other Ambulatory Visit: Payer: Self-pay | Admitting: Internal Medicine

## 2021-04-10 DIAGNOSIS — I10 Essential (primary) hypertension: Secondary | ICD-10-CM

## 2021-04-15 NOTE — Progress Notes (Signed)
Cardiology Office Note   Date:  04/28/2021   ID:  Henry Owen, DOB 1961/01/02, MRN [id number]  PCP:  Janith Lima, MD  Cardiologist:   Jenkins Rouge, MD   No chief complaint on file.     History of Present Illness:  60 y.o. f/u for CAD. CRF;s previous smoking and HTN.  Abnormal cardiac CT 12/22/18 Cath 01/20/19 with DES to proximal LAD/OM2 and residual distal LAD and small D1 stenosis.   Works  for [employer] from home last 15 years  Has 3 cats  Coreg started on 02/06/19 not on nitrates or Ranexa   He has recurrent Eczema particularly over right calf. Started on Eucrisa ointment by primary Dr Milus Banister increased for HTN   Will not get flu shot or COVID vaccine Discussed importance  No angina Intolerant to statins started on Praluent 09/18/19  Nice drop in LDL 138->58   Quit smoking 10/2018 after having COVID Discussed at least having home test available and getting Monoclonal antibody if he tests positive   Past Medical History:  Diagnosis Date  . BPH with obstruction/lower urinary tract symptoms   . CAD (coronary artery disease), native coronary artery 01/20/2019   cath a. 90% OM 2 S/p DES; 70% pLAD (FFR 0.77) s/p DES; 85% 1st dig - not good target  . Enlarged prostate   . Hypertension   . Hypothyroidism   . Tobacco use   . Warts, genital     Past Surgical History:  Procedure Laterality Date  . CORONARY STENT INTERVENTION N/A 01/20/2019   Procedure: CORONARY STENT INTERVENTION;  Surgeon: Leonie Man, MD;  Location: [place] [facility];  Service: Cardiovascular;  Laterality: N/A;  . INTRAVASCULAR PRESSURE WIRE/FFR STUDY N/A 01/20/2019   Procedure: INTRAVASCULAR PRESSURE WIRE/FFR STUDY;  Surgeon: Leonie Man, MD;  Location: [place] [facility];  Service: Cardiovascular;  Laterality: N/A;  . KNEE ARTHROSCOPY  2000  . LASIK  2005  . LEFT HEART CATH AND CORONARY ANGIOGRAPHY N/A 01/20/2019   Procedure: LEFT HEART CATH AND CORONARY ANGIOGRAPHY;  Surgeon:  Leonie Man, MD;  Location: [place];  Service: Cardiovascular;  Laterality: N/A;  . WISDOM TOOTH EXTRACTION       Current Outpatient Medications  Medication Sig Dispense Refill  . ASPIRIN LOW DOSE 81 MG EC tablet TAKE 1 TABLET BY MOUTH EVERY DAY 90 tablet 1  . carvedilol (COREG) 12.5 MG tablet TAKE 1 TABLET (12.5 MG TOTAL) BY MOUTH 2 (TWO) TIMES DAILY WITH A MEAL. 180 tablet 1  . chlorthalidone (HYGROTON) 25 MG tablet TAKE 1 TABLET BY MOUTH EVERY DAY 90 tablet 1  . Cholecalciferol 50 MCG (2000 UT) TABS Take 1 tablet (2,000 Units total) by mouth daily. 90 tablet 1  . clopidogrel (PLAVIX) 75 MG tablet TAKE 1 TABLET (75 MG TOTAL) BY MOUTH DAILY WITH BREAKFAST. 90 tablet 1  . Crisaborole (EUCRISA) 2 % OINT Apply 1 Act topically 2 (two) times daily. 100 g 5  . irbesartan (AVAPRO) 300 MG tablet TAKE 1 TABLET BY MOUTH EVERY DAY IN THE EVENING 90 tablet 1  . levothyroxine (SYNTHROID) 75 MCG tablet Take 1 tablet (75 mcg total) by mouth daily. 90 tablet 1  . nitroGLYCERIN (NITROSTAT) 0.4 MG SL tablet PLACE 1 TABLET (0.4 MG TOTAL) UNDER THE TONGUE EVERY 5 (FIVE) MINUTES AS NEEDED. 25 tablet 0  . PRALUENT 75 MG/ML SOAJ INJECT 1 PEN INTO THE SKIN EVERY 14 DAYS. 3 mL 3  No current facility-administered medications for this visit.    Allergies:   Patient has no known allergies.    Social History:  The patient  reports that he quit smoking about 2 years ago. His smoking use included cigarettes. He has a 37.50 pack-year smoking history. He has never used smokeless tobacco. He reports that he does not drink alcohol and does not use drugs.   Family History:  The patient's family history includes Diabetes in his mother; Early death in an other family member; Heart disease in his father; Hypertension in his father.    ROS:  Please see the history of present illness.   Otherwise, review of systems are positive for none.   All other systems are reviewed and negative.    PHYSICAL EXAM: VS:   There were no vitals taken for this visit. , BMI There is no height or weight on file to calculate BMI. Affect appropriate Healthy:  appears stated age 60: normal Neck supple with no adenopathy JVP normal no bruits no thyromegaly Lungs clear with no wheezing and good diaphragmatic motion Heart:  S1/S2 no murmur, no rub, gallop or click PMI normal Abdomen: benighn, BS positve, no tenderness, no AAA no bruit.  No HSM or HJR Distal pulses intact with no bruits No edema Neuro non-focal Skin warm and dry No muscular weakness  EKG:  SR LAD tall R in V1  10/06/18    Recent Labs: 03/03/2021: ALT 16; BUN 14; Creatinine, Ser 1.02; Hemoglobin 15.9; Platelets 193.0; Potassium 3.9; Sodium 137; TSH 2.73    Lipid Panel    Component Value Date/Time   CHOL 110 03/03/2021 0827   CHOL 126 12/22/2019 0735   TRIG 99.0 03/03/2021 0827   HDL 43.70 03/03/2021 0827   HDL 40 12/22/2019 0735   CHOLHDL 3 03/03/2021 0827   VLDL 19.8 03/03/2021 0827   LDLCALC 47 03/03/2021 0827   LDLCALC 58 12/22/2019 0735      Wt Readings from Last 3 Encounters:  03/03/21 96.6 kg  11/05/20 99.3 kg  09/25/20 98.9 kg       Other studies Reviewed: Additional studies/ records that were reviewed today include: Notes cardiology Dr Stanford Breed 2012 Stress Echo, notes Dr Ronnald Ramp primary ECG and labs . Cardiac CT 03/13/19  Cath/PCI 01/20/19     ASSESSMENT AND PLAN:  1.  CAD post DES to proximal LAD and OM2 with residual small D1 disease 01/20/19  EF normal DAT statin    2. HTN:  Improved with higher dose ARB 3. Smoking : no lung lesions on CT 12/22/18 quit 10/2018  4. HLD: at target now on praluent LDL 47 03/03/21  5. Thyroid:  On synthroid labs with primary TSH normal 03/03/21     Current medicines are reviewed at length with the patient today.  The patient does not have concerns regarding medicines.  The following changes have been made: none   Labs/ tests ordered today include:  Lung cancer screening CT     Disposition:   FU in a year     Signed, Jenkins Rouge, MD  04/28/2021 9:40 AM    [facility] [place], [place], [place]  [address] Phone: [phone]; Fax: [phone]

## 2021-04-28 ENCOUNTER — Encounter: Payer: Self-pay | Admitting: Cardiovascular Disease

## 2021-04-28 ENCOUNTER — Other Ambulatory Visit: Payer: Self-pay

## 2021-04-28 ENCOUNTER — Ambulatory Visit (INDEPENDENT_AMBULATORY_CARE_PROVIDER_SITE_OTHER): Payer: No Typology Code available for payment source | Admitting: Cardiovascular Disease

## 2021-04-28 VITALS — BP 133/87 | HR 97 | Ht 72.0 in | Wt 218.0 lb

## 2021-04-28 DIAGNOSIS — E782 Mixed hyperlipidemia: Secondary | ICD-10-CM | POA: Diagnosis not present

## 2021-04-28 DIAGNOSIS — I2510 Atherosclerotic heart disease of native coronary artery without angina pectoris: Secondary | ICD-10-CM | POA: Diagnosis not present

## 2021-04-28 NOTE — Patient Instructions (Signed)
Medication Instructions:  Your physician recommends that you continue on your current medications as directed. Please refer to the Current Medication list given to you today.  *If you need a refill on your cardiac medications before your next appointment, please call your pharmacy*   Lab Work: None today  If you have labs (blood work) drawn today and your tests are completely normal, you will receive your results only by: MyChart Message (if you have MyChart) OR A paper copy in the mail If you have any lab test that is abnormal or we need to change your treatment, we will call you to review the results.   Testing/Procedures: None today    Follow-Up: At [facility], you and your health needs are our priority.  As part of our continuing mission to provide you with exceptional heart care, we have created designated Provider Care Teams.  These Care Teams include your primary Cardiologist (physician) and Advanced Practice Providers (APPs -  Physician Assistants and Nurse Practitioners) who all work together to provide you with the care you need, when you need it.  We recommend signing up for the patient portal called "MyChart".  Sign up information is provided on this After Visit Summary.  MyChart is used to connect with patients for Virtual Visits (Telemedicine).  Patients are able to view lab/test results, encounter notes, upcoming appointments, etc.  Non-urgent messages can be sent to your provider as well.   To learn more about what you can do with MyChart, go to [email or web address].    Your next appointment:   12 month(s)  The format for your next appointment:   In Person  Provider:   Peter Nishan, MD   Other Instructions None     

## 2021-05-06 ENCOUNTER — Other Ambulatory Visit: Payer: Self-pay | Admitting: Internal Medicine

## 2021-05-06 DIAGNOSIS — I25118 Atherosclerotic heart disease of native coronary artery with other forms of angina pectoris: Secondary | ICD-10-CM

## 2021-05-06 DIAGNOSIS — I10 Essential (primary) hypertension: Secondary | ICD-10-CM

## 2021-06-23 ENCOUNTER — Other Ambulatory Visit: Payer: Self-pay

## 2021-06-23 MED ORDER — PRALUENT 75 MG/ML ~~LOC~~ SOAJ: SUBCUTANEOUS | 3 refills | Status: DC

## 2021-06-23 MED ORDER — PRALUENT 75 MG/ML ~~LOC~~ SOAJ: SUBCUTANEOUS | 11 refills | Status: DC

## 2021-08-15 ENCOUNTER — Other Ambulatory Visit: Payer: Self-pay | Admitting: Internal Medicine

## 2021-08-15 DIAGNOSIS — I25118 Atherosclerotic heart disease of native coronary artery with other forms of angina pectoris: Secondary | ICD-10-CM

## 2021-08-15 DIAGNOSIS — I10 Essential (primary) hypertension: Secondary | ICD-10-CM

## 2021-09-03 ENCOUNTER — Ambulatory Visit: Payer: No Typology Code available for payment source | Admitting: Internal Medicine

## 2021-10-14 ENCOUNTER — Other Ambulatory Visit: Payer: Self-pay | Admitting: Internal Medicine

## 2021-10-14 DIAGNOSIS — I25118 Atherosclerotic heart disease of native coronary artery with other forms of angina pectoris: Secondary | ICD-10-CM

## 2021-12-21 ENCOUNTER — Encounter: Payer: Self-pay | Admitting: Gastroenterology

## 2022-04-18 ENCOUNTER — Other Ambulatory Visit: Payer: Self-pay | Admitting: Internal Medicine

## 2022-04-18 DIAGNOSIS — I25118 Atherosclerotic heart disease of native coronary artery with other forms of angina pectoris: Secondary | ICD-10-CM

## 2022-06-10 NOTE — Progress Notes (Signed)
Cardiology Office Note   Date:  06/15/2022   ID:  Henry Owen, DOB Oct 20, 1961, MRN [id number]  PCP:  [name], MD  Cardiologist:   Jenkins Rouge, MD   No chief complaint on file.     History of Present Illness:  61 y.o. f/u for CAD. CRF;s previous smoking and HTN.  Abnormal cardiac CT 12/22/18 Cath 01/20/19 with DES to proximal LAD/OM2 and residual distal LAD and small D1 stenosis.   Works  for [employer] from home last 15 years  Has 3 cats  Coreg started on 02/06/19 not on nitrates or Ranexa   He has recurrent Eczema particularly over right calf. Started on Eucrisa ointment by primary Dr Milus Banister increased for HTN   Will not get flu shot or COVID vaccine Discussed importance  No angina Intolerant to statins started on Praluent 09/18/19  Nice drop in LDL 138->47 03/03/21  Quit smoking 10/2018 after having COVID    Discussed d/c Plavix at this point Needs no nitro No angina [facility] changing his praluent to repatha   Walking a lot more and feels better   Past Medical History:  Diagnosis Date   BPH with obstruction/lower urinary tract symptoms    CAD (coronary artery disease), native coronary artery 01/20/2019   cath a. 90% OM 2 S/p DES; 70% pLAD (FFR 0.77) s/p DES; 85% 1st dig - not good target   Enlarged prostate    Hypertension    Hypothyroidism    Tobacco use    Warts, genital     Past Surgical History:  Procedure Laterality Date   CORONARY STENT INTERVENTION N/A 01/20/2019   Procedure: CORONARY STENT INTERVENTION;  Surgeon: Leonie Man, MD;  Location: [facility];  Service: Cardiovascular;  Laterality: N/A;   INTRAVASCULAR PRESSURE WIRE/FFR STUDY N/A 01/20/2019   Procedure: INTRAVASCULAR PRESSURE WIRE/FFR STUDY;  Surgeon: Leonie Man, MD;  Location: [facility];  Service: Cardiovascular;  Laterality: N/A;   KNEE ARTHROSCOPY  2000   LASIK  2005   LEFT HEART CATH AND CORONARY ANGIOGRAPHY N/A 01/20/2019   Procedure: LEFT HEART  CATH AND CORONARY ANGIOGRAPHY;  Surgeon: Leonie Man, MD;  Location: [facility];  Service: Cardiovascular;  Laterality: N/A;   WISDOM TOOTH EXTRACTION       Current Outpatient Medications  Medication Sig Dispense Refill   Alirocumab (PRALUENT) 75 MG/ML SOAJ INJECT 1 PEN INTO THE SKIN EVERY 14 DAYS. 6 mL 3   ASPIRIN LOW DOSE 81 MG EC tablet TAKE 1 TABLET BY MOUTH EVERY DAY 90 tablet 1   carvedilol (COREG) 6.25 MG tablet Take 6.25 mg by mouth 2 (two) times daily.     chlorthalidone (HYGROTON) 25 MG tablet TAKE 1 TABLET BY MOUTH EVERY DAY 90 tablet 1   Cholecalciferol 50 MCG (2000 UT) TABS Take 1 tablet (2,000 Units total) by mouth daily. 90 tablet 1   irbesartan (AVAPRO) 150 MG tablet Take 150 mg by mouth daily.     latanoprost (XALATAN) 0.005 % ophthalmic solution Place 1 drop into both eyes at bedtime.     levothyroxine (SYNTHROID) 50 MCG tablet Take 50 mcg by mouth every morning.     nitroGLYCERIN (NITROSTAT) 0.4 MG SL tablet Place 1 tablet (0.4 mg total) under the tongue every 5 (five) minutes as needed. 25 tablet 3   No current facility-administered medications for this visit.    Allergies:   Patient has no known allergies.  Social History:  The patient  reports that he quit smoking about 3 years ago. His smoking use included cigarettes. He has a 37.50 pack-year smoking history. He has never used smokeless tobacco. He reports that he does not drink alcohol and does not use drugs.   Family History:  The patient's family history includes Diabetes in his mother; Early death in an other family member; Heart disease in his father; Hypertension in his father.    ROS:  Please see the history of present illness.   Otherwise, review of systems are positive for none.   All other systems are reviewed and negative.    PHYSICAL EXAM: VS:  BP (!) 142/98   Pulse 65   Ht 6' (1.829 m)   Wt 215 lb 6.4 oz (97.7 kg)   SpO2 96%   BMI 29.21 kg/m  , BMI Body mass index is 29.21  kg/m. Affect appropriate Healthy:  appears stated age 61: normal Neck supple with no adenopathy JVP normal no bruits no thyromegaly Lungs clear with no wheezing and good diaphragmatic motion Heart:  S1/S2 no murmur, no rub, gallop or click PMI normal Abdomen: benighn, BS positve, no tenderness, no AAA no bruit.  No HSM or HJR Distal pulses intact with no bruits No edema Neuro non-focal Skin warm and dry No muscular weakness  EKG:  SR LAD tall R in V1  10/06/18  06/15/2022 NSR rate 65 normal   Recent Labs: No results found for requested labs within last 365 days.    Lipid Panel    Component Value Date/Time   CHOL 110 03/03/2021 0827   CHOL 126 12/22/2019 0735   TRIG 99.0 03/03/2021 0827   HDL 43.70 03/03/2021 0827   HDL 40 12/22/2019 0735   CHOLHDL 3 03/03/2021 0827   VLDL 19.8 03/03/2021 0827   LDLCALC 47 03/03/2021 0827   LDLCALC 58 12/22/2019 0735      Wt Readings from Last 3 Encounters:  06/15/22 215 lb 6.4 oz (97.7 kg)  04/28/21 218 lb (98.9 kg)  03/03/21 213 lb (96.6 kg)       Other studies Reviewed: Additional studies/ records that were reviewed today include: Notes cardiology Dr Stanford Breed 2012 Stress Echo, notes Dr Ronnald Ramp primary ECG and labs . Cardiac CT 03/13/19  Cath/PCI 01/20/19     ASSESSMENT AND PLAN:  1.  CAD post DES to proximal LAD and OM2 with residual small D1 disease 01/20/19  EF normal DAT statin    2. HTN:  Improved with higher dose ARB 3. Smoking : no lung lesions on CT 12/22/18 quit 10/2018  4. HLD: at target now on praluent LDL 47 03/03/21  5. Thyroid:  On synthroid labs with primary TSH normal 03/03/21    D/C Plavix New nitro Pharm D to see about co pay card for repatha   The following changes have been made: none   Labs/ tests ordered today include:  none    Disposition:   FU in a year     Signed, Jenkins Rouge, MD  06/15/2022 2:23 PM    [address], [place], [place]  [address] Phone:  [phone]; Fax: [phone]

## 2022-06-15 ENCOUNTER — Ambulatory Visit (INDEPENDENT_AMBULATORY_CARE_PROVIDER_SITE_OTHER): Payer: No Typology Code available for payment source | Admitting: Cardiovascular Disease

## 2022-06-15 ENCOUNTER — Encounter: Payer: Self-pay | Admitting: Cardiovascular Disease

## 2022-06-15 VITALS — BP 142/98 | HR 65 | Ht 72.0 in | Wt 215.4 lb

## 2022-06-15 DIAGNOSIS — E782 Mixed hyperlipidemia: Secondary | ICD-10-CM | POA: Diagnosis not present

## 2022-06-15 DIAGNOSIS — I10 Essential (primary) hypertension: Secondary | ICD-10-CM

## 2022-06-15 DIAGNOSIS — I2510 Atherosclerotic heart disease of native coronary artery without angina pectoris: Secondary | ICD-10-CM

## 2022-06-15 MED ORDER — NITROGLYCERIN 0.4 MG SL SUBL: 0.4000 mg | SUBLINGUAL_TABLET | SUBLINGUAL | 3 refills | Status: DC | PRN

## 2022-06-15 NOTE — Patient Instructions (Signed)
Medication Instructions:   Stop Taking Plavix   *If you need a refill on your cardiac medications before your next appointment, please call your pharmacy*   Lab Work: NONE   If you have labs (blood work) drawn today and your tests are completely normal, you will receive your results only by: [facility] (if you have MyChart) OR A paper copy in the mail If you have any lab test that is abnormal or we need to change your treatment, we will call you to review the results.   Testing/Procedures: NONE    Follow-Up: At [facility], you and your health needs are our priority.  As part of our continuing mission to provide you with exceptional heart care, we have created designated Provider Care Teams.  These Care Teams include your primary Cardiologist (physician) and Advanced Practice Providers (APPs -  Physician Assistants and Nurse Practitioners) who all work together to provide you with the care you need, when you need it.  We recommend signing up for the patient portal called "MyChart".  Sign up information is provided on this After Visit Summary.  MyChart is used to connect with patients for Virtual Visits (Telemedicine).  Patients are able to view lab/test results, encounter notes, upcoming appointments, etc.  Non-urgent messages can be sent to your provider as well.   To learn more about what you can do with MyChart, go to [email or web address].    Your next appointment:   1 year(s)  The format for your next appointment:   In Person  Provider:   Jenkins Rouge, MD    Other Instructions Thank you for choosing [facility]!    Important Information About Sugar

## 2022-06-17 ENCOUNTER — Encounter: Payer: Self-pay | Admitting: Pharmacist

## 2022-06-17 ENCOUNTER — Telehealth: Payer: Self-pay | Admitting: Pharmacist

## 2022-06-17 MED ORDER — REPATHA SURECLICK 140 MG/ML ~~LOC~~ SOAJ: 1.0000 | SUBCUTANEOUS | 11 refills | Status: DC

## 2022-06-17 NOTE — Telephone Encounter (Addendum)
Patients insurance now covers Repatha instead of Praluent. Repatha PA approved through 06/17/23. Copay card activated.  RxBin: [id number] RxPCN: [id number] RxGrp: [id number] ID: [id number]  Spoke to patient. Made him aware of the change. Will send copay card info to his mychart.

## 2022-08-10 ENCOUNTER — Telehealth: Payer: Self-pay | Admitting: *Deleted

## 2022-08-10 NOTE — Telephone Encounter (Signed)
   Pre-operative Risk Assessment    Patient Name: Henry Owen  DOB: 01/30/1961 MRN: [id number]      Request for Surgical Clearance    Procedure:   COLONOSCOPY; SCREENING FOR COLON CANCER  Date of Surgery:  Clearance 09/25/22                                 Surgeon:  DR. Wilford Corner Surgeon's Group or Practice Name:  [facility] Phone number:  [phone] Fax number:  [phone]   Type of Clearance Requested:   - Medical ; NO MEDICATIONS ARE LISTED AS NEEDING TO BE HELD]   Type of Anesthesia:   PROPOFOL    Additional requests/questions:    Jiles Prows   08/10/2022, 4:35 PM

## 2022-08-11 ENCOUNTER — Telehealth: Payer: Self-pay | Admitting: *Deleted

## 2022-08-11 NOTE — Telephone Encounter (Signed)
   Name: Henry Owen  DOB: May 28, 1961  MRN: [id number]  Primary Cardiologist: Jenkins Rouge, MD   Preoperative team, please contact this patient and set up a phone call appointment for further preoperative risk assessment. Please obtain consent and complete medication review. Thank you for your help.  I confirm that guidance regarding antiplatelet and oral anticoagulation therapy has been completed and, if necessary, noted below.   Richardson Dopp, PA-C 08/11/2022, 8:01 AM [place]

## 2022-08-11 NOTE — Telephone Encounter (Signed)
Pt agreeable to plan of care for tele pre op appt 08/31/22 @ 9 am. Med rec and consent are done.    Pt does tell me that in regard to his Repatha, [facility] and that he is now past due for his injection that he should have just had. Pt states he would really like to go back on Praluent. I regard to the [place], I assured the pt that I will have the Pharm-D call him to discuss further.  Pt thanked me for the help.

## 2022-08-11 NOTE — Telephone Encounter (Signed)
Pt agreeable to plan of care for tele pre op appt 08/31/22 @ 9 am. Med rec and consent are done.   Pt does tell me that in regard to his Repatha, that [facility] and that he is now past due for his injection that he should have just had. Pt states he would really like to go back on Praluent. I regard to the [place], I assured the pt that I will have the Pharm-D call him to discuss further.  Pt thanked me for the help.      Patient Consent for Virtual Visit        Henry Owen has provided verbal consent on 08/11/2022 for a virtual visit (video or telephone).   CONSENT FOR VIRTUAL VISIT FOR:  Henry Owen  By participating in this virtual visit I agree to the following:  I hereby voluntarily request, consent and authorize [facility] and its employed or contracted physicians, physician assistants, nurse practitioners or other licensed health care professionals (the Practitioner), to provide me with telemedicine health care services (the "Services") as deemed necessary by the treating Practitioner. I acknowledge and consent to receive the Services by the Practitioner via telemedicine. I understand that the telemedicine visit will involve communicating with the Practitioner through live audiovisual communication technology and the disclosure of certain medical information by electronic transmission. I acknowledge that I have been given the opportunity to request an in-person assessment or other available alternative prior to the telemedicine visit and am voluntarily participating in the telemedicine visit.  I understand that I have the right to withhold or withdraw my consent to the use of telemedicine in the course of my care at any time, without affecting my right to future care or treatment, and that the Practitioner or I may terminate the telemedicine visit at any time. I understand that I have the right to inspect all information obtained  and/or recorded in the course of the telemedicine visit and may receive copies of available information for a reasonable fee.  I understand that some of the potential risks of receiving the Services via telemedicine include:  Delay or interruption in medical evaluation due to technological equipment failure or disruption; Information transmitted may not be sufficient (e.g. poor resolution of images) to allow for appropriate medical decision making by the Practitioner; and/or  In rare instances, security protocols could fail, causing a breach of personal health information.  Furthermore, I acknowledge that it is my responsibility to provide information about my medical history, conditions and care that is complete and accurate to the best of my ability. I acknowledge that Practitioner's advice, recommendations, and/or decision may be based on factors not within their control, such as incomplete or inaccurate data provided by me or distortions of diagnostic images or specimens that may result from electronic transmissions. I understand that the practice of medicine is not an exact science and that Practitioner makes no warranties or guarantees regarding treatment outcomes. I acknowledge that a copy of this consent can be made available to me via my patient portal ([facility]), or I can request a printed copy by calling the office of [facility].    I understand that my insurance will be billed for this visit.   I have read or had this consent read to me. I understand the contents of this consent, which adequately explains the benefits and risks of the Services being provided via telemedicine.  I  have been provided ample opportunity to ask questions regarding this consent and the Services and have had my questions answered to my satisfaction. I give my informed consent for the services to be provided through the use of telemedicine in my medical care

## 2022-08-21 NOTE — Telephone Encounter (Signed)
Talked to patient. Explained clinically there is not much difference between Repatha and praluent. There should not be any supply issues with repatha and he should contact his pharmacy and question why it has not been filled yet. [facility]

## 2022-08-31 ENCOUNTER — Ambulatory Visit
Payer: No Typology Code available for payment source | Attending: Interventional Cardiology | Admitting: Physician Assistant

## 2022-08-31 DIAGNOSIS — Z01818 Encounter for other preprocedural examination: Secondary | ICD-10-CM

## 2022-08-31 NOTE — Progress Notes (Signed)
Virtual Visit via Telephone Note   Because of Henry Owen's co-morbid illnesses, he is at least at moderate risk for complications without adequate follow up.  This format is felt to be most appropriate for this patient at this time.  The patient did not have access to video technology/had technical difficulties with video requiring transitioning to audio format only (telephone).  All issues noted in this document were discussed and addressed.  No physical exam could be performed with this format.  Please refer to the patient's chart for his consent to telehealth for [facility].  Evaluation Performed:  Preoperative cardiovascular risk assessment _____________   Date:  08/31/2022   Patient ID:  Henry Owen, DOB Jun 06, 1961, MRN [id number] Patient Location:  Home Provider location:   Office  Primary Care Provider:  [name], MD Primary Cardiologist:  Jenkins Rouge, MD  Chief Complaint / Patient Profile   61 y.o. y/o male with a h/o CAD, previous smoking, hypertension, abnormal CT scan 12/22/2018 and cardiac catheterization 01/20/2019 with DES to proximal LAD/OM 2 and residual distal LAD and small D1 stenosis who is pending colonoscopy, screening for colon cancer and presents today for telephonic preoperative cardiovascular risk assessment.  Past Medical History    Past Medical History:  Diagnosis Date   BPH with obstruction/lower urinary tract symptoms    CAD (coronary artery disease), native coronary artery 01/20/2019   cath a. 90% OM 2 S/p DES; 70% pLAD (FFR 0.77) s/p DES; 85% 1st dig - not good target   Enlarged prostate    Hypertension    Hypothyroidism    Tobacco use    Warts, genital    Past Surgical History:  Procedure Laterality Date   CORONARY STENT INTERVENTION N/A 01/20/2019   Procedure: CORONARY STENT INTERVENTION;  Surgeon: Leonie Man, MD;  Location: [facility];  Service: Cardiovascular;  Laterality: N/A;   INTRAVASCULAR PRESSURE  WIRE/FFR STUDY N/A 01/20/2019   Procedure: INTRAVASCULAR PRESSURE WIRE/FFR STUDY;  Surgeon: Leonie Man, MD;  Location: [facility];  Service: Cardiovascular;  Laterality: N/A;   KNEE ARTHROSCOPY  2000   LASIK  2005   LEFT HEART CATH AND CORONARY ANGIOGRAPHY N/A 01/20/2019   Procedure: LEFT HEART CATH AND CORONARY ANGIOGRAPHY;  Surgeon: Leonie Man, MD;  Location: [facility];  Service: Cardiovascular;  Laterality: N/A;   WISDOM TOOTH EXTRACTION      Allergies  No Known Allergies  History of Present Illness    Henry Owen is a 61 y.o. male who presents via audio/video conferencing for a telehealth visit today.  Pt was last seen in cardiology clinic on 06/15/2022 by Dr. Johnsie Cancel.  At that time Henry Owen was doing well .  The patient is now pending procedure as outlined above. Since his last visit, he has been doing very well without any cardiovascular issues.  He walks for 30 minutes every day.  He states that he has some sport injuries that make stairs more difficult but he does do them.  He has no issues with any household tasks as well as tasks in the yard.  For this reason, he is scored a 5.62 METS on the DASI.  This exceeds the minimum 4 METS minimum requirement.   No medications are listed as needing to be held.   Home Medications    Prior to Admission medications   Medication Sig Start Date End Date Taking? Authorizing Provider  ASPIRIN LOW DOSE 81 MG  EC tablet TAKE 1 TABLET BY MOUTH EVERY DAY 05/06/21   Janith Lima, MD  carvedilol (COREG) 6.25 MG tablet Take 6.25 mg by mouth 2 (two) times daily. 04/18/22   [provider]  chlorthalidone (HYGROTON) 25 MG tablet TAKE 1 TABLET BY MOUTH EVERY DAY 04/10/21   Janith Lima, MD  Cholecalciferol 50 MCG (2000 UT) TABS Take 1 tablet (2,000 Units total) by mouth daily. 12/28/19   Janith Lima, MD  Evolocumab (REPATHA SURECLICK) 814 MG/ML SOAJ Inject 1 Pen into the skin every 14 (fourteen) days.  06/17/22   Josue Hector, MD  irbesartan (AVAPRO) 150 MG tablet Take 150 mg by mouth daily. 04/18/22   [provider]  latanoprost (XALATAN) 0.005 % ophthalmic solution Place 1 drop into both eyes at bedtime.    [provider]  levothyroxine (SYNTHROID) 50 MCG tablet Take 50 mcg by mouth every morning. 04/23/22   [provider]  nitroGLYCERIN (NITROSTAT) 0.4 MG SL tablet Place 1 tablet (0.4 mg total) under the tongue every 5 (five) minutes as needed. 06/15/22   Josue Hector, MD    Physical Exam    Vital Signs:  Henry Owen does not have vital signs available for review today.  Given telephonic nature of communication, physical exam is limited. AAOx3. NAD. Normal affect.  Speech and respirations are unlabored.  Accessory Clinical Findings    None  Assessment & Plan    1.  Preoperative Cardiovascular Risk Assessment:  Henry Owen perioperative risk of a major cardiac event is 0.9% according to the Revised Cardiac Risk Index (RCRI).  Therefore, he is at low risk for perioperative complications.   His functional capacity is good at 5.62 METs according to the [place] Activity Status Index (DASI). Recommendations: According to ACC/AHA guidelines, no further cardiovascular testing needed.  The patient may proceed to surgery at acceptable risk.   Antiplatelet and/or Anticoagulation Recommendations: Continue aspirin.   A copy of this note will be routed to requesting surgeon.  Time:   Today, I have spent 7 minutes with the patient with telehealth technology discussing medical history, symptoms, and management plan.     Elgie Collard, PA-C  08/31/2022, 7:52 AM

## 2023-05-20 ENCOUNTER — Other Ambulatory Visit (HOSPITAL_COMMUNITY): Payer: Self-pay

## 2023-05-22 ENCOUNTER — Other Ambulatory Visit (HOSPITAL_COMMUNITY): Payer: Self-pay

## 2023-05-22 ENCOUNTER — Telehealth: Payer: Self-pay

## 2023-05-22 NOTE — Telephone Encounter (Signed)
Pharmacy Patient Advocate Encounter   Received notification from [facility] that prior authorization for REPATHA 140MG /ML is required/requested.   PA submitted to [facility] via CoverMyMeds Key  # [id number]  Status is pending

## 2023-05-24 ENCOUNTER — Other Ambulatory Visit (HOSPITAL_COMMUNITY): Payer: Self-pay

## 2023-05-25 NOTE — Telephone Encounter (Signed)
Pharmacy Patient Advocate Encounter  Prior Authorization for REPATHA has been APPROVED by [facility] from 6.9.24 to 6.9.25.

## 2023-06-11 NOTE — Progress Notes (Signed)
Cardiology Office Note   Date:  06/24/2023   ID:  Henry Owen, DOB 1961-08-08, MRN [id number]  PCP:  Aliene Beams, MD  Cardiologist:   Charlton Haws, MD    History of Present Illness:  62 y.o. f/u for CAD. CRF;s previous smoking and HTN.  Abnormal cardiac CT 12/22/18 Cath 01/20/19 with DES to proximal LAD/OM2 and residual distal LAD and small D1 stenosis.   Works  for [employer] from home last 15 years  Has 3 cats  Coreg started on 02/06/19 not on nitrates or Ranexa   He has recurrent Eczema particularly over right calf. Started on Eucrisa ointment by primary Dr Virgie Dad increased for HTN   No angina Intolerant to statins started on Praluent to goal and now on Repatha due to insurance reasons   Quit smoking 10/2018 after having COVID    F/U colonoscopy done per patient and had more benign polyps removed    Past Medical History:  Diagnosis Date   BPH with obstruction/lower urinary tract symptoms    CAD (coronary artery disease), native coronary artery 01/20/2019   cath a. 90% OM 2 S/p DES; 70% pLAD (FFR 0.77) s/p DES; 85% 1st dig - not good target   Enlarged prostate    Hypertension    Hypothyroidism    Tobacco use    Warts, genital     Past Surgical History:  Procedure Laterality Date   CORONARY PRESSURE/FFR STUDY N/A 01/20/2019   Procedure: INTRAVASCULAR PRESSURE WIRE/FFR STUDY;  Surgeon: Marykay Lex, MD;  Location: [facility] INVASIVE CV LAB;  Service: Cardiovascular;  Laterality: N/A;   CORONARY STENT INTERVENTION N/A 01/20/2019   Procedure: CORONARY STENT INTERVENTION;  Surgeon: Marykay Lex, MD;  Location: [facility] INVASIVE CV LAB;  Service: Cardiovascular;  Laterality: N/A;   KNEE ARTHROSCOPY  2000   LASIK  2005   LEFT HEART CATH AND CORONARY ANGIOGRAPHY N/A 01/20/2019   Procedure: LEFT HEART CATH AND CORONARY ANGIOGRAPHY;  Surgeon: Marykay Lex, MD;  Location: [facility] INVASIVE CV LAB;  Service: Cardiovascular;  Laterality: N/A;   WISDOM TOOTH EXTRACTION        Current Outpatient Medications  Medication Sig Dispense Refill   ASPIRIN LOW DOSE 81 MG EC tablet TAKE 1 TABLET BY MOUTH EVERY DAY 90 tablet 1   bimatoprost (LUMIGAN) 0.03 % ophthalmic solution Place 1 drop into both eyes at bedtime.     carvedilol (COREG) 6.25 MG tablet Take 6.25 mg by mouth 2 (two) times daily.     chlorthalidone (HYGROTON) 25 MG tablet TAKE 1 TABLET BY MOUTH EVERY DAY 90 tablet 1   Evolocumab (REPATHA SURECLICK) 140 MG/ML SOAJ Inject 1 Pen into the skin every 14 (fourteen) days. 2 mL 11   irbesartan (AVAPRO) 150 MG tablet Take 150 mg by mouth daily.     levothyroxine (SYNTHROID) 50 MCG tablet Take 50 mcg by mouth every morning.     LUMIGAN 0.01 % SOLN 1 drop at bedtime.     nitroGLYCERIN (NITROSTAT) 0.4 MG SL tablet Place 1 tablet (0.4 mg total) under the tongue every 5 (five) minutes as needed. 25 tablet 3   No current facility-administered medications for this visit.    Allergies:   Patient has no known allergies.    Social History:  The patient  reports that he quit smoking about 4 years ago. His smoking use included cigarettes. He started smoking about 29 years ago. He has a 37.5 pack-year smoking history. He has never used  smokeless tobacco. He reports that he does not drink alcohol and does not use drugs.   Family History:  The patient's family history includes Diabetes in his mother; Early death in an other family member; Heart disease in his father; Hypertension in his father.    ROS:  Please see the history of present illness.   Otherwise, review of systems are positive for none.   All other systems are reviewed and negative.    PHYSICAL EXAM: VS:  BP 134/82   Pulse 65   Ht 6' (1.829 m)   Wt 208 lb 6.4 oz (94.5 kg)   SpO2 96%   BMI 28.26 kg/m  , BMI Body mass index is 28.26 kg/m.  Affect appropriate Healthy:  appears stated age HEENT: normal Neck supple with no adenopathy JVP normal no bruits no thyromegaly Lungs clear with no wheezing and  good diaphragmatic motion Heart:  S1/S2 no murmur, no rub, gallop or click PMI normal Abdomen: benighn, BS positve, no tenderness, no AAA no bruit.  No HSM or HJR Distal pulses intact with no bruits No edema Neuro non-focal Skin warm and dry No muscular weakness  EKG:  SR LAD tall R in V1  10/06/18  06/24/2023 NSR rate 65 normal   Recent Labs: No results found for requested labs within last 365 days.    Lipid Panel    Component Value Date/Time   CHOL 110 03/03/2021 0827   CHOL 126 12/22/2019 0735   TRIG 99.0 03/03/2021 0827   HDL 43.70 03/03/2021 0827   HDL 40 12/22/2019 0735   CHOLHDL 3 03/03/2021 0827   VLDL 19.8 03/03/2021 0827   LDLCALC 47 03/03/2021 0827   LDLCALC 58 12/22/2019 0735      Wt Readings from Last 3 Encounters:  06/24/23 208 lb 6.4 oz (94.5 kg)  06/15/22 215 lb 6.4 oz (97.7 kg)  04/28/21 218 lb (98.9 kg)       Other studies Reviewed: Additional studies/ records that were reviewed today include: Notes cardiology Dr Jens Som 2012 Stress Echo, notes Dr Yetta Barre primary ECG and labs . Cardiac CT 03/13/19  Cath/PCI 01/20/19     ASSESSMENT AND PLAN:  1.  CAD post DES to proximal LAD and OM2 with residual small D1 disease 01/20/19  EF normal Now off plavix , continue statin    2. HTN:  Improved with higher dose ARB 3. Smoking : no lung lesions on CT 12/22/18 quit 10/2018  4. HLD: at target now on praluent LDL 47 03/03/21  5. Thyroid:  On synthroid labs with primary TSH normal 03/03/21     Disposition:   FU in a year     Signed, Charlton Haws, MD  06/24/2023 11:21 AM    [facility] [address] [place], [place], [place]  [address] Phone: [phone]; Fax: [phone]

## 2023-06-24 ENCOUNTER — Ambulatory Visit
Payer: No Typology Code available for payment source | Attending: Cardiovascular Disease | Admitting: Cardiovascular Disease

## 2023-06-24 VITALS — BP 134/82 | HR 65 | Ht 72.0 in | Wt 208.4 lb

## 2023-06-24 DIAGNOSIS — I2510 Atherosclerotic heart disease of native coronary artery without angina pectoris: Secondary | ICD-10-CM

## 2023-06-24 DIAGNOSIS — I10 Essential (primary) hypertension: Secondary | ICD-10-CM | POA: Diagnosis not present

## 2023-06-24 DIAGNOSIS — E782 Mixed hyperlipidemia: Secondary | ICD-10-CM | POA: Diagnosis not present

## 2023-06-24 NOTE — Patient Instructions (Signed)
Medication Instructions:  Your physician recommends that you continue on your current medications as directed. Please refer to the Current Medication list given to you today.  *If you need a refill on your cardiac medications before your next appointment, please call your pharmacy*  Lab Work: If you have labs (blood work) drawn today and your tests are completely normal, you will receive your results only by: MyChart Message (if you have MyChart) OR A paper copy in the mail If you have any lab test that is abnormal or we need to change your treatment, we will call you to review the results.  Testing/Procedures: None ordered today.  Follow-Up: At [facility], you and your health needs are our priority.  As part of our continuing mission to provide you with exceptional heart care, we have created designated Provider Care Teams.  These Care Teams include your primary Cardiologist (physician) and Advanced Practice Providers (APPs -  Physician Assistants and Nurse Practitioners) who all work together to provide you with the care you need, when you need it.  We recommend signing up for the patient portal called "MyChart".  Sign up information is provided on this After Visit Summary.  MyChart is used to connect with patients for Virtual Visits (Telemedicine).  Patients are able to view lab/test results, encounter notes, upcoming appointments, etc.  Non-urgent messages can be sent to your provider as well.   To learn more about what you can do with MyChart, go to [email or web address].    Your next appointment:   1 year(s)  Provider:   You may see Charlton Haws, MD or one of the following Advanced Practice Providers on your designated Care Team:   [place], PA-C  Jacolyn Reedy, [place]

## 2023-06-30 ENCOUNTER — Other Ambulatory Visit: Payer: Self-pay | Admitting: Cardiovascular Disease

## 2024-07-02 ENCOUNTER — Other Ambulatory Visit: Payer: Self-pay | Admitting: Cardiovascular Disease

## 2024-08-15 NOTE — Progress Notes (Signed)
 Cardiology Office Note   Date:  08/24/2024   ID:  Henry Owen, DOB 22-Oct-1961, MRN [id number]  PCP:  Rolinda Millman, MD  Cardiologist:   Maude Emmer, MD    History of Present Illness:  63 y.o. f/u for CAD. CRF;s previous smoking and HTN.  Abnormal cardiac CT 12/22/18 Cath 01/20/19 with DES to proximal LAD/OM2 and residual distal LAD and small D1 stenosis.   Works  for [employer] from home last 15 years  Has 3 cats  Coreg  started on 02/06/19 not on nitrates or Ranexa   He has recurrent Eczema particularly over right calf. 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 90% OM 2 S/p DES; 70% pLAD (FFR 0.77) s/p DES; 85% 1st dig - not good target   Enlarged prostate    Hypertension    Hypothyroidism    Tobacco use    Warts, genital     Past Surgical History:  Procedure Laterality Date   CORONARY PRESSURE/FFR STUDY N/A 01/20/2019   Procedure: INTRAVASCULAR PRESSURE WIRE/FFR STUDY;  Surgeon: Anner Alm ORN, MD;  Location: [facility] INVASIVE CV LAB;  Service: Cardiovascular;  Laterality: N/A;   CORONARY STENT INTERVENTION N/A 01/20/2019   Procedure: CORONARY STENT INTERVENTION;  Surgeon: Anner Alm ORN, MD;  Location: [facility] INVASIVE CV LAB;  Service: Cardiovascular;   Laterality: N/A;   KNEE ARTHROSCOPY  2000   LASIK  2005   LEFT HEART CATH AND CORONARY ANGIOGRAPHY N/A 01/20/2019   Procedure: LEFT HEART CATH AND CORONARY ANGIOGRAPHY;  Surgeon: Anner Alm ORN, MD;  Location: [facility] INVASIVE CV LAB;  Service: Cardiovascular;  Laterality: N/A;   WISDOM TOOTH EXTRACTION       Current Outpatient Medications  Medication Sig Dispense Refill   ASPIRIN  LOW DOSE 81 MG EC tablet TAKE 1 TABLET BY MOUTH EVERY DAY 90 tablet 1   carvedilol  (COREG ) 6.25 MG tablet Take 6.25 mg by mouth 2 (two) times daily.     chlorthalidone  (HYGROTON ) 25 MG tablet TAKE 1 TABLET BY MOUTH EVERY DAY (Patient taking differently: Take 12.5 mg by mouth daily.) 90 tablet 1   Evolocumab  (REPATHA  SURECLICK) 140 MG/ML SOAJ INJECT 1 PEN INTO THE SKIN EVERY 14 (FOURTEEN) DAYS. 6 mL 3   irbesartan  (AVAPRO ) 150 MG tablet Take 150 mg by mouth daily.     levothyroxine  (SYNTHROID ) 88 MCG tablet Take 88 mcg by mouth every morning.     nitroGLYCERIN  (NITROSTAT ) 0.4 MG SL tablet Place 1 tablet (0.4 mg total) under the tongue every 5 (five) minutes as needed. 25 tablet 3   bimatoprost (LUMIGAN) 0.03 % ophthalmic solution Place 1 drop into both eyes at bedtime.     LUMIGAN 0.01 % SOLN 1 drop at bedtime.  No current facility-administered medications for this visit.    Allergies:   Patient has no known allergies.    Social History:  The patient  reports that he quit smoking about 5 years ago. His smoking use included cigarettes. He started smoking about 30 years ago. He has a 37.5 pack-year smoking history. He has never used smokeless tobacco. He reports that he does not drink alcohol and does not use drugs.   Family History:  The patient's family history includes Diabetes in his mother; Early death in an other family member; Heart disease in his father; Hypertension in his father.    ROS:  Please see the history of present illness.   Otherwise, review of systems are positive for none.   All other systems  are reviewed and negative.    PHYSICAL EXAM: VS:  BP 120/82 (BP Location: Right Arm, Cuff Size: Normal)   Pulse 66   Ht 6' (1.829 m)   Wt 205 lb 6.4 oz (93.2 kg)   SpO2 97%   BMI 27.86 kg/m  , BMI Body mass index is 27.86 kg/m.  Affect appropriate Healthy:  appears stated age HEENT: normal Neck supple with no adenopathy JVP normal no bruits no thyromegaly Lungs clear with no wheezing and good diaphragmatic motion Heart:  S1/S2 no murmur, no rub, gallop or click PMI normal Abdomen: benighn, BS positve, no tenderness, no AAA no bruit.  No HSM or HJR Distal pulses intact with no bruits No edema Neuro non-focal Skin warm and dry No muscular weakness  EKG:  SR LAD tall R in V1  10/06/18  08/24/2024 NSR rate 65 normal   Recent Labs: No results found for requested labs within last 365 days.    Lipid Panel    Component Value Date/Time   CHOL 110 03/03/2021 0827   CHOL 126 12/22/2019 0735   TRIG 99.0 03/03/2021 0827   HDL 43.70 03/03/2021 0827   HDL 40 12/22/2019 0735   CHOLHDL 3 03/03/2021 0827   VLDL 19.8 03/03/2021 0827   LDLCALC 47 03/03/2021 0827   LDLCALC 58 12/22/2019 0735      Wt Readings from Last 3 Encounters:  08/24/24 205 lb 6.4 oz (93.2 kg)  06/24/23 208 lb 6.4 oz (94.5 kg)  06/15/22 215 lb 6.4 oz (97.7 kg)       Other studies Reviewed: Additional studies/ records that were reviewed today include: Notes cardiology Dr Pietro 2012 Stress Echo, notes Dr Joshua primary ECG and labs . Cardiac CT 03/13/19  Cath/PCI 01/20/19     ASSESSMENT AND PLAN:  1.  CAD post DES to proximal LAD and OM2 with residual small D1 disease 01/20/19  EF normal Now off plavix  , continue statin   2. HTN:  Improved with higher dose ARB 3. Smoking : no lung lesions on CT 12/22/18 quit 10/2018  4. HLD: at target now on Repatha  labs with primary He indicates LDL in 40's earlier this summer 5. Thyroid :  On synthroid  labs with primary TSH normal 03/03/21    New nitroglycerin      Disposition:   FU in a year     Signed, Maude Emmer, MD  08/24/2024 9:30 AM    [facility] [address] [place], [place], [place]  [address] Phone: [phone]; Fax: [phone]

## 2024-08-24 ENCOUNTER — Encounter: Payer: Self-pay | Admitting: Cardiovascular Disease

## 2024-08-24 ENCOUNTER — Ambulatory Visit: Attending: Cardiovascular Disease | Admitting: Cardiovascular Disease

## 2024-08-24 VITALS — BP 120/82 | HR 66 | Ht 72.0 in | Wt 205.4 lb

## 2024-08-24 DIAGNOSIS — E782 Mixed hyperlipidemia: Secondary | ICD-10-CM | POA: Diagnosis not present

## 2024-08-24 DIAGNOSIS — I2510 Atherosclerotic heart disease of native coronary artery without angina pectoris: Secondary | ICD-10-CM

## 2024-08-24 DIAGNOSIS — I10 Essential (primary) hypertension: Secondary | ICD-10-CM | POA: Diagnosis not present

## 2024-08-24 MED ORDER — NITROGLYCERIN 0.4 MG SL SUBL: 0.4000 mg | SUBLINGUAL_TABLET | SUBLINGUAL | 3 refills | Status: AC | PRN

## 2024-08-24 NOTE — Patient Instructions (Signed)
 Medication Instructions:  Your physician recommends that you continue on your current medications as directed. Please refer to the Current Medication list given to you today.   Labwork: None today  Testing/Procedures: None today  Follow-Up: 1 year  Any Other Special Instructions Will Be Listed Below (If Applicable).  If you need a refill on your cardiac medications before your next appointment, please call your pharmacy.
